# Patient Record
Sex: Male | Born: 1990 | Race: Black or African American | Hispanic: No | Marital: Single | Smoking: Former smoker
Health system: Southern US, Community
[De-identification: ages and names within clinical notes are randomized; demographics above are authoritative.]

## PROBLEM LIST (undated history)

## (undated) DIAGNOSIS — K219 Gastro-esophageal reflux disease without esophagitis: Secondary | ICD-10-CM

## (undated) HISTORY — PX: NO PAST SURGERIES: SHX2092

---

## 2014-01-25 ENCOUNTER — Emergency Department (HOSPITAL_COMMUNITY): Payer: Self-pay

## 2014-01-25 ENCOUNTER — Emergency Department (HOSPITAL_COMMUNITY): Payer: Self-pay | Admitting: Certified Registered"

## 2014-01-25 ENCOUNTER — Emergency Department (HOSPITAL_COMMUNITY): Payer: MEDICAID | Admitting: Certified Registered"

## 2014-01-25 ENCOUNTER — Encounter (HOSPITAL_COMMUNITY): Payer: Self-pay | Admitting: Emergency Medicine

## 2014-01-25 ENCOUNTER — Encounter (HOSPITAL_COMMUNITY): Admission: EM | Disposition: A | Discharge status: Home / Self Care | Payer: Self-pay | Source: Home / Self Care

## 2014-01-25 ENCOUNTER — Inpatient Hospital Stay (HOSPITAL_COMMUNITY)
Admission: EM | Admit: 2014-01-25 | Discharge: 2014-01-29 | DRG: 342 | Disposition: A | Discharge status: Home / Self Care | Payer: Self-pay | Attending: General Surgery | Admitting: General Surgery

## 2014-01-25 DIAGNOSIS — K920 Hematemesis: Secondary | ICD-10-CM | POA: Diagnosis present

## 2014-01-25 DIAGNOSIS — R160 Hepatomegaly, not elsewhere classified: Secondary | ICD-10-CM | POA: Diagnosis present

## 2014-01-25 DIAGNOSIS — R1031 Right lower quadrant pain: Secondary | ICD-10-CM

## 2014-01-25 DIAGNOSIS — R161 Splenomegaly, not elsewhere classified: Secondary | ICD-10-CM | POA: Diagnosis present

## 2014-01-25 DIAGNOSIS — Z87891 Personal history of nicotine dependence: Secondary | ICD-10-CM

## 2014-01-25 DIAGNOSIS — K3580 Unspecified acute appendicitis: Principal | ICD-10-CM | POA: Diagnosis present

## 2014-01-25 DIAGNOSIS — D72829 Elevated white blood cell count, unspecified: Secondary | ICD-10-CM

## 2014-01-25 DIAGNOSIS — R1013 Epigastric pain: Secondary | ICD-10-CM | POA: Insufficient documentation

## 2014-01-25 DIAGNOSIS — K353 Acute appendicitis with localized peritonitis, without perforation or gangrene: Secondary | ICD-10-CM

## 2014-01-25 DIAGNOSIS — K219 Gastro-esophageal reflux disease without esophagitis: Secondary | ICD-10-CM | POA: Diagnosis present

## 2014-01-25 HISTORY — DX: Gastro-esophageal reflux disease without esophagitis: K21.9

## 2014-01-25 HISTORY — PX: LAPAROSCOPIC APPENDECTOMY: SHX408

## 2014-01-25 LAB — COMPREHENSIVE METABOLIC PANEL
ALT: 30 U/L (ref 0–53)
AST: 16 U/L (ref 0–37)
Albumin: 4.9 g/dL (ref 3.5–5.2)
Alkaline Phosphatase: 110 U/L (ref 39–117)
Anion gap: 15 (ref 5–15)
BILIRUBIN TOTAL: 1.1 mg/dL (ref 0.3–1.2)
BUN: 14 mg/dL (ref 6–23)
CALCIUM: 10.7 mg/dL — AB (ref 8.4–10.5)
CHLORIDE: 93 meq/L — AB (ref 96–112)
CO2: 30 meq/L (ref 19–32)
Creatinine, Ser: 0.99 mg/dL (ref 0.50–1.35)
GFR calc Af Amer: >90 mL/min (ref >90)
GLUCOSE: 117 mg/dL — AB (ref 70–99)
Potassium: 4.7 mEq/L (ref 3.7–5.3)
SODIUM: 138 meq/L (ref 137–147)
Total Protein: 8.9 g/dL — ABNORMAL HIGH (ref 6.0–8.3)

## 2014-01-25 LAB — CBC WITH DIFFERENTIAL/PLATELET
Basophils Absolute: 0 10*3/uL (ref 0.0–0.1)
Basophils Relative: 0 % (ref 0–1)
Eosinophils Absolute: 0 10*3/uL (ref 0.0–0.7)
Eosinophils Relative: 0 % (ref 0–5)
HEMATOCRIT: 45.7 % (ref 39.0–52.0)
Hemoglobin: 16.7 g/dL (ref 13.0–17.0)
LYMPHS ABS: 1.2 10*3/uL (ref 0.7–4.0)
LYMPHS PCT: 7 % — AB (ref 12–46)
MCH: 32 pg (ref 26.0–34.0)
MCHC: 36.5 g/dL — ABNORMAL HIGH (ref 30.0–36.0)
MCV: 87.5 fL (ref 78.0–100.0)
MONO ABS: 1.3 10*3/uL — AB (ref 0.1–1.0)
Monocytes Relative: 7 % (ref 3–12)
Neutro Abs: 14.7 10*3/uL — ABNORMAL HIGH (ref 1.7–7.7)
Neutrophils Relative %: 86 % — ABNORMAL HIGH (ref 43–77)
PLATELETS: 278 10*3/uL (ref 150–400)
RBC: 5.22 MIL/uL (ref 4.22–5.81)
RDW: 12 % (ref 11.5–15.5)
WBC: 17.1 10*3/uL — AB (ref 4.0–10.5)

## 2014-01-25 LAB — URINALYSIS, ROUTINE W REFLEX MICROSCOPIC
GLUCOSE, UA: NEGATIVE mg/dL
Hgb urine dipstick: NEGATIVE
KETONES UR: 15 mg/dL — AB
Leukocytes, UA: NEGATIVE
Nitrite: NEGATIVE
PROTEIN: 30 mg/dL — AB
Specific Gravity, Urine: 1.025 (ref 1.005–1.030)
Urobilinogen, UA: 1 mg/dL (ref 0.0–1.0)
pH: 8 (ref 5.0–8.0)

## 2014-01-25 LAB — LIPASE, BLOOD: Lipase: 11 U/L (ref 11–59)

## 2014-01-25 LAB — URINE MICROSCOPIC-ADD ON: Urine-Other: NONE SEEN

## 2014-01-25 LAB — POC OCCULT BLOOD, ED: Fecal Occult Bld: POSITIVE — AB

## 2014-01-25 SURGERY — APPENDECTOMY, LAPAROSCOPIC
Anesthesia: General | Site: Abdomen

## 2014-01-25 MED ORDER — LACTATED RINGERS IR SOLN
Status: DC | PRN
  Administered 2014-01-25: 1

## 2014-01-25 MED ORDER — BUPIVACAINE-EPINEPHRINE 0.25% -1:200000 IJ SOLN
INTRAMUSCULAR | Status: DC | PRN
  Administered 2014-01-25: 13 mL

## 2014-01-25 MED ORDER — DEXAMETHASONE SODIUM PHOSPHATE 10 MG/ML IJ SOLN
INTRAMUSCULAR | Status: DC | PRN
  Administered 2014-01-25: 10 mg via INTRAVENOUS

## 2014-01-25 MED ORDER — LIDOCAINE HCL (CARDIAC) 20 MG/ML IV SOLN
INTRAVENOUS | Status: AC
  Filled 2014-01-25: qty 5

## 2014-01-25 MED ORDER — ROCURONIUM BROMIDE 100 MG/10ML IV SOLN
INTRAVENOUS | Status: DC | PRN
  Administered 2014-01-25: 30 mg via INTRAVENOUS
  Administered 2014-01-25: 10 mg via INTRAVENOUS

## 2014-01-25 MED ORDER — IOHEXOL 300 MG/ML  SOLN
100.0000 mL | Freq: Once | INTRAMUSCULAR | Status: AC | PRN
  Administered 2014-01-25: 100 mL via INTRAVENOUS

## 2014-01-25 MED ORDER — HYDROMORPHONE HCL 1 MG/ML IJ SOLN
1.0000 mg | Freq: Once | INTRAMUSCULAR | Status: AC
  Administered 2014-01-25: 1 mg via INTRAVENOUS
  Filled 2014-01-25: qty 1

## 2014-01-25 MED ORDER — PANTOPRAZOLE SODIUM 40 MG IV SOLR
40.0000 mg | Freq: Once | INTRAVENOUS | Status: AC
  Administered 2014-01-25: 40 mg via INTRAVENOUS
  Filled 2014-01-25: qty 40

## 2014-01-25 MED ORDER — 0.9 % SODIUM CHLORIDE (POUR BTL) OPTIME
TOPICAL | Status: DC | PRN
  Administered 2014-01-25: 1000 mL

## 2014-01-25 MED ORDER — ONDANSETRON HCL 4 MG/2ML IJ SOLN
INTRAMUSCULAR | Status: AC
  Filled 2014-01-25: qty 2

## 2014-01-25 MED ORDER — SUCCINYLCHOLINE CHLORIDE 20 MG/ML IJ SOLN
INTRAMUSCULAR | Status: DC | PRN
  Administered 2014-01-25: 100 mg via INTRAVENOUS

## 2014-01-25 MED ORDER — MIDAZOLAM HCL 5 MG/5ML IJ SOLN
INTRAMUSCULAR | Status: DC | PRN
  Administered 2014-01-25: 2 mg via INTRAVENOUS

## 2014-01-25 MED ORDER — HYDROMORPHONE HCL 1 MG/ML IJ SOLN
0.2500 mg | INTRAMUSCULAR | Status: DC | PRN
  Administered 2014-01-26 (×3): 0.5 mg via INTRAVENOUS
  Administered 2014-01-26: 0.25 mg via INTRAVENOUS

## 2014-01-25 MED ORDER — BUPIVACAINE-EPINEPHRINE (PF) 0.25% -1:200000 IJ SOLN
INTRAMUSCULAR | Status: AC
  Filled 2014-01-25: qty 30

## 2014-01-25 MED ORDER — SODIUM CHLORIDE 0.9 % IV SOLN
1.0000 g | INTRAVENOUS | Status: DC
  Administered 2014-01-25: 1 g via INTRAVENOUS
  Filled 2014-01-25: qty 1

## 2014-01-25 MED ORDER — FENTANYL CITRATE 0.05 MG/ML IJ SOLN
INTRAMUSCULAR | Status: AC
  Filled 2014-01-25: qty 5

## 2014-01-25 MED ORDER — FENTANYL CITRATE 0.05 MG/ML IJ SOLN
INTRAMUSCULAR | Status: DC | PRN
  Administered 2014-01-25 (×2): 50 ug via INTRAVENOUS
  Administered 2014-01-25: 100 ug via INTRAVENOUS
  Administered 2014-01-26: 50 ug via INTRAVENOUS

## 2014-01-25 MED ORDER — NEOSTIGMINE METHYLSULFATE 10 MG/10ML IV SOLN
INTRAVENOUS | Status: DC | PRN
  Administered 2014-01-25: 4 mg via INTRAVENOUS

## 2014-01-25 MED ORDER — SODIUM CHLORIDE 0.9 % IV BOLUS (SEPSIS)
1000.0000 mL | Freq: Once | INTRAVENOUS | Status: AC
  Administered 2014-01-25: 1000 mL via INTRAVENOUS

## 2014-01-25 MED ORDER — PROPOFOL 10 MG/ML IV BOLUS
INTRAVENOUS | Status: AC
  Filled 2014-01-25: qty 20

## 2014-01-25 MED ORDER — ONDANSETRON HCL 4 MG/2ML IJ SOLN
INTRAMUSCULAR | Status: DC | PRN
  Administered 2014-01-25: 4 mg via INTRAVENOUS

## 2014-01-25 MED ORDER — LACTATED RINGERS IV SOLN
INTRAVENOUS | Status: DC | PRN
  Administered 2014-01-25 (×2): via INTRAVENOUS

## 2014-01-25 MED ORDER — IOHEXOL 300 MG/ML  SOLN
25.0000 mL | Freq: Once | INTRAMUSCULAR | Status: AC | PRN
  Administered 2014-01-25: 25 mL via ORAL

## 2014-01-25 MED ORDER — LACTATED RINGERS IV SOLN: INTRAVENOUS | Status: DC

## 2014-01-25 MED ORDER — GLYCOPYRROLATE 0.2 MG/ML IJ SOLN
INTRAMUSCULAR | Status: DC | PRN
  Administered 2014-01-25: 0.6 mg via INTRAVENOUS

## 2014-01-25 MED ORDER — PROMETHAZINE HCL 25 MG/ML IJ SOLN: 6.2500 mg | INTRAMUSCULAR | Status: DC | PRN

## 2014-01-25 MED ORDER — ROCURONIUM BROMIDE 100 MG/10ML IV SOLN
INTRAVENOUS | Status: AC
  Filled 2014-01-25: qty 1

## 2014-01-25 MED ORDER — LIDOCAINE HCL (CARDIAC) 20 MG/ML IV SOLN
INTRAVENOUS | Status: DC | PRN
  Administered 2014-01-25: 50 mg via INTRAVENOUS

## 2014-01-25 MED ORDER — SODIUM CHLORIDE 0.9 % IV SOLN
INTRAVENOUS | Status: AC
  Filled 2014-01-25: qty 1

## 2014-01-25 MED ORDER — PROPOFOL 10 MG/ML IV BOLUS
INTRAVENOUS | Status: DC | PRN
  Administered 2014-01-25: 200 mg via INTRAVENOUS

## 2014-01-25 MED ORDER — DEXAMETHASONE SODIUM PHOSPHATE 10 MG/ML IJ SOLN
INTRAMUSCULAR | Status: AC
  Filled 2014-01-25: qty 1

## 2014-01-25 MED ORDER — MIDAZOLAM HCL 2 MG/2ML IJ SOLN
INTRAMUSCULAR | Status: AC
  Filled 2014-01-25: qty 2

## 2014-01-25 SURGICAL SUPPLY — 34 items
APPLIER CLIP ROT 10 11.4 M/L (STAPLE) ×3
CANISTER SUCTION 2500CC (MISCELLANEOUS) IMPLANT
CLIP APPLIE ROT 10 11.4 M/L (STAPLE) ×1 IMPLANT
CUTTER FLEX LINEAR 45M (STAPLE) ×3 IMPLANT
DECANTER SPIKE VIAL GLASS SM (MISCELLANEOUS) ×3 IMPLANT
DRAPE LAPAROSCOPIC ABDOMINAL (DRAPES) ×3 IMPLANT
DRAPE UTILITY XL STRL (DRAPES) ×3 IMPLANT
ELECT REM PT RETURN 9FT ADLT (ELECTROSURGICAL) ×3
ELECTRODE REM PT RTRN 9FT ADLT (ELECTROSURGICAL) ×1 IMPLANT
ENDOLOOP SUT PDS II  0 18 (SUTURE)
ENDOLOOP SUT PDS II 0 18 (SUTURE) IMPLANT
GLOVE BIO SURGEON STRL SZ7.5 (GLOVE) ×6 IMPLANT
GOWN STRL REUS W/ TWL XL LVL3 (GOWN DISPOSABLE) ×1 IMPLANT
GOWN STRL REUS W/TWL XL LVL3 (GOWN DISPOSABLE) ×8 IMPLANT
IV LACTATED RINGERS 1000ML (IV SOLUTION) ×3 IMPLANT
KIT BASIN OR (CUSTOM PROCEDURE TRAY) ×3 IMPLANT
LIQUID BAND (GAUZE/BANDAGES/DRESSINGS) ×3 IMPLANT
NS IRRIG 1000ML POUR BTL (IV SOLUTION) ×3 IMPLANT
PENCIL BUTTON HOLSTER BLD 10FT (ELECTRODE) IMPLANT
POUCH SPECIMEN RETRIEVAL 10MM (ENDOMECHANICALS) ×3 IMPLANT
RELOAD 45 THICK GREEN (ENDOMECHANICALS) ×18 IMPLANT
RELOAD 45 VASCULAR/THIN (ENDOMECHANICALS) IMPLANT
RELOAD STAPLE TA45 3.5 REG BLU (ENDOMECHANICALS) ×3 IMPLANT
SET IRRIG TUBING LAPAROSCOPIC (IRRIGATION / IRRIGATOR) IMPLANT
SHEARS HARMONIC ACE PLUS 36CM (ENDOMECHANICALS) IMPLANT
SOLUTION ANTI FOG 6CC (MISCELLANEOUS) ×3 IMPLANT
SUT MNCRL AB 4-0 PS2 18 (SUTURE) ×3 IMPLANT
TOWEL OR 17X26 10 PK STRL BLUE (TOWEL DISPOSABLE) ×3 IMPLANT
TRAY FOLEY CATH 14FRSI W/METER (CATHETERS) ×3 IMPLANT
TRAY LAPAROSCOPIC (CUSTOM PROCEDURE TRAY) ×3 IMPLANT
TROCAR BLADELESS OPT 5 75 (ENDOMECHANICALS) ×3 IMPLANT
TROCAR SLEEVE XCEL 5X75 (ENDOMECHANICALS) ×3 IMPLANT
TROCAR XCEL BLUNT TIP 100MML (ENDOMECHANICALS) ×3 IMPLANT
TUBING INSUFFLATION 10FT LAP (TUBING) ×3 IMPLANT

## 2014-01-25 NOTE — Anesthesia Preprocedure Evaluation (Signed)
Anesthesia Evaluation  Patient identified by MRN, date of birth, ID band Patient awake    Reviewed: Allergy & Precautions, H&P , NPO status , Patient's Chart, lab work & pertinent test results  Airway Mallampati: II  TM Distance: >3 FB Neck ROM: Full    Dental no notable dental hx.    Pulmonary former smoker,  breath sounds clear to auscultation  Pulmonary exam normal       Cardiovascular negative cardio ROS  Rhythm:Regular Rate:Normal     Neuro/Psych negative neurological ROS  negative psych ROS   GI/Hepatic Neg liver ROS, GERD-  ,  Endo/Other  negative endocrine ROS  Renal/GU negative Renal ROS  negative genitourinary   Musculoskeletal negative musculoskeletal ROS (+)   Abdominal   Peds negative pediatric ROS (+)  Hematology negative hematology ROS (+)   Anesthesia Other Findings   Reproductive/Obstetrics negative OB ROS                             Anesthesia Physical Anesthesia Plan  ASA: II and emergent  Anesthesia Plan: General   Post-op Pain Management:    Induction: Intravenous  Airway Management Planned: Oral ETT  Additional Equipment:   Intra-op Plan:   Post-operative Plan: Extubation in OR  Informed Consent: I have reviewed the patients History and Physical, chart, labs and discussed the procedure including the risks, benefits and alternatives for the proposed anesthesia with the patient or authorized representative who has indicated his/her understanding and acceptance.   Dental advisory given  Plan Discussed with: CRNA  Anesthesia Plan Comments:         Anesthesia Quick Evaluation

## 2014-01-25 NOTE — H&P (Signed)
Dustin Singh is an 23 y.o. male.   Chief Complaint: abdominal pain HPI: The pt is a 23yo wm who presents with abdominal pain for the last 3 days. It started in his epigastric area and today moved to the RLQ. He has had significant nausea and vomiting associated with it. He denies fever. CT shows appendicitis but no evidence of rupture  Past Medical History  Diagnosis Date  . GERD (gastroesophageal reflux disease)     Past Surgical History  Procedure Laterality Date  . No past surgeries      History reviewed. No pertinent family history. Social History:  reports that he quit smoking about 6 months ago. His smoking use included Cigarettes. He smoked 0.00 packs per day for 8 years. He has never used smokeless tobacco. He reports that he drinks alcohol. He reports that he does not use illicit drugs.  Allergies:  Allergies  Allergen Reactions  . Sulfa Antibiotics Other (See Comments)    Reaction occurred in childhood. Pt can't remember what happened     (Not in a hospital admission)  Results for orders placed or performed during the hospital encounter of 01/25/14 (from the past 48 hour(s))  CBC with Differential     Status: Abnormal   Collection Time: 01/25/14  4:25 PM  Result Value Ref Range   WBC 17.1 (H) 4.0 - 10.5 K/uL   RBC 5.22 4.22 - 5.81 MIL/uL   Hemoglobin 16.7 13.0 - 17.0 g/dL   HCT 87.4 89.7 - 35.7 %   MCV 87.5 78.0 - 100.0 fL   MCH 32.0 26.0 - 34.0 pg   MCHC 36.5 (H) 30.0 - 36.0 g/dL   RDW 24.2 42.4 - 44.5 %   Platelets 278 150 - 400 K/uL   Neutrophils Relative % 86 (H) 43 - 77 %   Neutro Abs 14.7 (H) 1.7 - 7.7 K/uL   Lymphocytes Relative 7 (L) 12 - 46 %   Lymphs Abs 1.2 0.7 - 4.0 K/uL   Monocytes Relative 7 3 - 12 %   Monocytes Absolute 1.3 (H) 0.1 - 1.0 K/uL   Eosinophils Relative 0 0 - 5 %   Eosinophils Absolute 0.0 0.0 - 0.7 K/uL   Basophils Relative 0 0 - 1 %   Basophils Absolute 0.0 0.0 - 0.1 K/uL  Comprehensive metabolic panel     Status: Abnormal   Collection Time: 01/25/14  4:25 PM  Result Value Ref Range   Sodium 138 137 - 147 mEq/L   Potassium 4.7 3.7 - 5.3 mEq/L   Chloride 93 (L) 96 - 112 mEq/L   CO2 30 19 - 32 mEq/L   Glucose, Bld 117 (H) 70 - 99 mg/dL   BUN 14 6 - 23 mg/dL   Creatinine, Ser 3.67 0.50 - 1.35 mg/dL   Calcium 61.1 (H) 8.4 - 10.5 mg/dL   Total Protein 8.9 (H) 6.0 - 8.3 g/dL   Albumin 4.9 3.5 - 5.2 g/dL   AST 16 0 - 37 U/L   ALT 30 0 - 53 U/L   Alkaline Phosphatase 110 39 - 117 U/L   Total Bilirubin 1.1 0.3 - 1.2 mg/dL   GFR calc non Af Amer >90 >90 mL/min   GFR calc Af Amer >90 >90 mL/min    Comment: (NOTE) The eGFR has been calculated using the CKD EPI equation. This calculation has not been validated in all clinical situations. eGFR's persistently <90 mL/min signify possible Chronic Kidney Disease.    Anion gap 15 5 -  15  Lipase, blood     Status: None   Collection Time: 01/25/14  4:25 PM  Result Value Ref Range   Lipase 11 11 - 59 U/L  Urinalysis, Routine w reflex microscopic     Status: Abnormal   Collection Time: 01/25/14  6:09 PM  Result Value Ref Range   Color, Urine AMBER (A) YELLOW    Comment: BIOCHEMICALS MAY BE AFFECTED BY COLOR   APPearance CLEAR CLEAR   Specific Gravity, Urine 1.025 1.005 - 1.030   pH 8.0 5.0 - 8.0   Glucose, UA NEGATIVE NEGATIVE mg/dL   Hgb urine dipstick NEGATIVE NEGATIVE   Bilirubin Urine SMALL (A) NEGATIVE   Ketones, ur 15 (A) NEGATIVE mg/dL   Protein, ur 30 (A) NEGATIVE mg/dL   Urobilinogen, UA 1.0 0.0 - 1.0 mg/dL   Nitrite NEGATIVE NEGATIVE   Leukocytes, UA NEGATIVE NEGATIVE  Urine microscopic-add on     Status: None   Collection Time: 01/25/14  6:09 PM  Result Value Ref Range   Urine-Other      NO FORMED ELEMENTS SEEN ON URINE MICROSCOPIC EXAMINATION  POC occult blood, ED     Status: Abnormal   Collection Time: 01/25/14  6:29 PM  Result Value Ref Range   Fecal Occult Bld POSITIVE (A) NEGATIVE   Ct Abdomen Pelvis W Contrast  01/25/2014   CLINICAL  DATA:  Three-day history of abdominal pain with tenderness right lower quadrant  EXAM: CT ABDOMEN AND PELVIS WITH CONTRAST  TECHNIQUE: Multidetector CT imaging of the abdomen and pelvis was performed using the standard protocol following bolus administration of intravenous contrast. Oral contrast was also administered.  CONTRAST:  59mL OMNIPAQUE IOHEXOL 300 MG/ML SOLN, 179mL OMNIPAQUE IOHEXOL 300 MG/ML SOLN  COMPARISON:  None.  FINDINGS: Lung bases are clear.  Liver is enlarged, measuring 19.3 cm in length. There is hepatic steatosis. No focal liver lesions are identified. Gallbladder wall is not thickened. There is no biliary duct dilatation.  Spleen is prominent measuring 12.5 x 13.7 x 9.1 cm. No focal splenic lesions are identified.  Pancreas and adrenals appear normal. Kidneys bilaterally show no mass or hydronephrosis on either side. There is no renal or ureteral calculus on either side.  In the pelvis, urinary bladder is midline with normal wall thickness. There is no pelvic mass or fluid collection.  The appendix is dilated with surrounding inflammation. There are two appendicoliths within the appendix. This appearance is consistent with acute appendicitis. There is no frank abscess. There is edema at the base of the appendix impressing upon the medial cecum.  There is no bowel obstruction.  No free air or portal venous air.  There is no ascites, adenopathy, or abscess in the abdomen or pelvis. There is no demonstrable abdominal aortic aneurysm. There are no blastic or lytic bone lesions.  IMPRESSION: Evidence of acute appendicitis without frank abscess.  Enlarged liver and spleen.  Hepatic steatosis is present.  No adenopathy. No abscess. No bowel obstruction. No renal or ureteral calculus. No hydronephrosis.  Critical Value/emergent results were called by telephone at the time of interpretation on 01/25/2014 at 8:25 pm to [facility], [place] , who verbally acknowledged these results.   Electronically Signed    By: Lowella Grip M.D.   On: 01/25/2014 20:25    Review of Systems  Constitutional: Negative.   HENT: Negative.   Eyes: Negative.   Respiratory: Negative.   Cardiovascular: Negative.   Gastrointestinal: Positive for nausea, vomiting and abdominal pain.  Genitourinary: Negative.  Musculoskeletal: Positive for back pain.  Skin: Negative.   Neurological: Negative.   Endo/Heme/Allergies: Negative.   Psychiatric/Behavioral: Negative.     Blood pressure 127/69, pulse 70, temperature 98.5 F (36.9 C), temperature source Oral, resp. rate 20, SpO2 97 %. Physical Exam  Constitutional: He is oriented to person, place, and time. He appears well-developed and well-nourished.  HENT:  Head: Normocephalic and atraumatic.  Eyes: Conjunctivae and EOM are normal. Pupils are equal, round, and reactive to light.  Neck: Normal range of motion. Neck supple.  Cardiovascular: Normal rate, regular rhythm and normal heart sounds.   Respiratory: Effort normal and breath sounds normal.  GI: Soft. Bowel sounds are normal.  Tender in RLQ. No peritonitis  Musculoskeletal: Normal range of motion.  Neurological: He is alert and oriented to person, place, and time.  Skin: Skin is warm and dry.  Psychiatric: He has a normal mood and affect. His behavior is normal.     Assessment/Plan The pt appears to have acute appendicitis. Because of the risk of perforation and sepsis I think he would benefit from having his appendix removed. I have discussed with him the risks and benefits of the surgery as well as some of the technical aspects and he understands and wishes to proceed.  TOTH III,Makynlee Kressin S 01/25/2014, 9:46 PM

## 2014-01-25 NOTE — ED Provider Notes (Signed)
CSN: [id number]     Arrival date & time 01/25/14  1539 History   First MD Initiated Contact with Patient 01/25/14 1617     Chief Complaint  Patient presents with  . Hematemesis  . Abdominal Pain     (Consider location/radiation/quality/duration/timing/severity/associated sxs/prior Treatment) HPI Mr. Weightman is a 23 year old male with no past medical history who presents the ER tonight for hematemesis.patient states his hematemesis began 3 days ago, and has a dark red/coffee ground appearance. Patient denies nausea, and states that hi hematemesis begins whenever he feels pain in his abdomen. Patient  Reports an intermittent epigastric pain which is sharp and radiates to his back.patient states for the past several weeks he has noticed some "heartburn" and has been taking over-the-counter for relief. Patient states the pain causes him to vomit.patient denies dizziness, weakness, chest pain, shortness of breath, diarrhea, dysuria, hematochezia, melena.patient reports he was seen at an urgent care earlier today who recommended he be seen in the emergency room. He had a workup with blood work, chest x-rays there. I am unable to obtain results from his visit earlier today.  Past Medical History  Diagnosis Date  . GERD (gastroesophageal reflux disease)    Past Surgical History  Procedure Laterality Date  . No past surgeries     History reviewed. No pertinent family history. History  Substance Use Topics  . Smoking status: Former Smoker -- 8 years    Types: Cigarettes    Quit date: 07/25/2013  . Smokeless tobacco: Never Used  . Alcohol Use: Yes     Comment: 4 beers every other day    Review of Systems  Constitutional: Negative for fever.  HENT: Negative for trouble swallowing.   Eyes: Negative for visual disturbance.  Respiratory: Negative for shortness of breath.   Cardiovascular: Negative for chest pain.  Gastrointestinal: Positive for abdominal pain. Negative for nausea, vomiting and  blood in stool.  Genitourinary: Negative for dysuria.  Musculoskeletal: Negative for neck pain.  Skin: Negative for rash.  Neurological: Negative for dizziness, weakness and numbness.  Psychiatric/Behavioral: Negative.       Allergies  Sulfa antibiotics  Home Medications   Prior to Admission medications   Medication Sig Start Date End Date Taking? Authorizing Provider  aspirin 325 MG tablet Take 650 mg by mouth daily as needed for moderate pain (pain).   Yes Historical Provider, MD  ibuprofen (ADVIL,MOTRIN) 200 MG tablet Take 800 mg by mouth every 6 (six) hours as needed for moderate pain (pain).   Yes Historical Provider, MD   BP 136/75 mmHg  Pulse 75  Temp(Src) 98.8 F (37.1 C) (Oral)  Resp 12  SpO2 97% Physical Exam  Constitutional: He is oriented to person, place, and time. He appears well-developed and well-nourished. No distress.  HENT:  Head: Normocephalic and atraumatic.  Mouth/Throat: Oropharynx is clear and moist. No oropharyngeal exudate.  Eyes: Right eye exhibits no discharge. Left eye exhibits no discharge. No scleral icterus.  Neck: Normal range of motion.  Cardiovascular: Normal rate, regular rhythm, S1 normal, S2 normal and normal heart sounds.   No murmur heard. Pulses:      Radial pulses are 2+ on the right side, and 2+ on the left side.       Dorsalis pedis pulses are 2+ on the right side, and 2+ on the left side.  Pulmonary/Chest: Effort normal and breath sounds normal. No accessory muscle usage. No tachypnea. No respiratory distress.  Abdominal: Soft. Normal appearance and bowel sounds are normal.  There is tenderness in the right lower quadrant. There is tenderness at McBurney's point. There is no rigidity, no rebound, no guarding and negative Murphy's sign.  Musculoskeletal: Normal range of motion. He exhibits no edema or tenderness.  Neurological: He is alert and oriented to person, place, and time. He has normal strength. No cranial nerve deficit or  sensory deficit. Coordination normal. GCS eye subscore is 4. GCS verbal subscore is 5. GCS motor subscore is 6.  Skin: Skin is warm and dry. No rash noted. He is not diaphoretic.  Psychiatric: He has a normal mood and affect.  Nursing note and vitals reviewed.   ED Course  Procedures (including critical care time) Labs Review Labs Reviewed  CBC WITH DIFFERENTIAL - Abnormal; Notable for the following:    WBC 17.1 (*)    MCHC 36.5 (*)    Neutrophils Relative % 86 (*)    Neutro Abs 14.7 (*)    Lymphocytes Relative 7 (*)    Monocytes Absolute 1.3 (*)    All other components within normal limits  COMPREHENSIVE METABOLIC PANEL - Abnormal; Notable for the following:    Chloride 93 (*)    Glucose, Bld 117 (*)    Calcium 10.7 (*)    Total Protein 8.9 (*)    All other components within normal limits  URINALYSIS, ROUTINE W REFLEX MICROSCOPIC - Abnormal; Notable for the following:    Color, Urine AMBER (*)    Bilirubin Urine SMALL (*)    Ketones, ur 15 (*)    Protein, ur 30 (*)    All other components within normal limits  POC OCCULT BLOOD, ED - Abnormal; Notable for the following:    Fecal Occult Bld POSITIVE (*)    All other components within normal limits  LIPASE, BLOOD  URINE MICROSCOPIC-ADD ON    Imaging Review Ct Abdomen Pelvis W Contrast  01/25/2014   CLINICAL DATA:  Three-day history of abdominal pain with tenderness right lower quadrant  EXAM: CT ABDOMEN AND PELVIS WITH CONTRAST  TECHNIQUE: Multidetector CT imaging of the abdomen and pelvis was performed using the standard protocol following bolus administration of intravenous contrast. Oral contrast was also administered.  CONTRAST:  25mL OMNIPAQUE IOHEXOL 300 MG/ML SOLN, 100mL OMNIPAQUE IOHEXOL 300 MG/ML SOLN  COMPARISON:  None.  FINDINGS: Lung bases are clear.  Liver is enlarged, measuring 19.3 cm in length. There is hepatic steatosis. No focal liver lesions are identified. Gallbladder wall is not thickened. There is no biliary  duct dilatation.  Spleen is prominent measuring 12.5 x 13.7 x 9.1 cm. No focal splenic lesions are identified.  Pancreas and adrenals appear normal. Kidneys bilaterally show no mass or hydronephrosis on either side. There is no renal or ureteral calculus on either side.  In the pelvis, urinary bladder is midline with normal wall thickness. There is no pelvic mass or fluid collection.  The appendix is dilated with surrounding inflammation. There are two appendicoliths within the appendix. This appearance is consistent with acute appendicitis. There is no frank abscess. There is edema at the base of the appendix impressing upon the medial cecum.  There is no bowel obstruction.  No free air or portal venous air.  There is no ascites, adenopathy, or abscess in the abdomen or pelvis. There is no demonstrable abdominal aortic aneurysm. There are no blastic or lytic bone lesions.  IMPRESSION: Evidence of acute appendicitis without frank abscess.  Enlarged liver and spleen.  Hepatic steatosis is present.  No adenopathy. No abscess. No  bowel obstruction. No renal or ureteral calculus. No hydronephrosis.  Critical Value/emergent results were called by telephone at the time of interpretation on 01/25/2014 at 8:25 pm to [facility] Gosnell[facility] , who verbally acknowledged these results.   Electronically Signed   By: Bretta BangWilliam  Woodruff M.D.   On: 01/25/2014 20:25     EKG Interpretation None      MDM   Final diagnoses:  Right lower quadrant abdominal pain    Patient here complaining of intermittent abdominal pain, hematemesis. Patient's pain reproducible in right lower quadrant, lab work shows leukocytosis of 17. We will follow-up with CT abdomen pelvis for rule out of appendicitis. Fecal occult blood positive.Symptomatic therapy.  8:25 PM: Notified by Dr. Margarita GrizzleWoodruff with radiology that patient has an acute appendicitis on CT. Consult placed to general surgery.  Spoke with scrub nurse in the room with Dr. Carolynne Edouardoth in the OR  who was made aware of patient's case. Dr. Carolynne Edouardoth agreed to evaluate patient to accept for surgery.The patient appears reasonably stabilized for admission considering the current resources, flow, and capabilities available in the ED at this time, and I doubt any other [facility] further screening and/or treatment in the ED prior to admission.  BP 136/75 mmHg  Pulse 75  Temp(Src) 98.8 F (37.1 C) (Oral)  Resp 12  SpO2 97%  Signed,  Ladona MowJoe Saahas Hidrogo, PA-C 2:03 AM Patient seen and discussed with Dr. Jerelyn ScottMartha Linker, M.D.   Monte FantasiaJoseph W Maven Varelas, PA-C 01/26/14 [id number]  Ethelda ChickMartha K Linker, MD 01/26/14 [phone]

## 2014-01-25 NOTE — ED Notes (Signed)
Pt states he went to urgent care and had blood taken and xrays done. States he has had abdominal pain and has been throwing up blood. States his pain is mostly midabdomen but he had pain upon palpation in the lower R quadrant. Alert and oriented.

## 2014-01-26 ENCOUNTER — Encounter (HOSPITAL_COMMUNITY): Payer: Self-pay | Admitting: General Surgery

## 2014-01-26 DIAGNOSIS — K3580 Unspecified acute appendicitis: Secondary | ICD-10-CM | POA: Diagnosis present

## 2014-01-26 MED ORDER — HYDROMORPHONE HCL 1 MG/ML IJ SOLN
INTRAMUSCULAR | Status: AC
  Filled 2014-01-26: qty 1

## 2014-01-26 MED ORDER — OXYCODONE-ACETAMINOPHEN 5-325 MG PO TABS
1.0000 | ORAL_TABLET | ORAL | Status: DC | PRN
  Administered 2014-01-26 – 2014-01-29 (×10): 2 via ORAL
  Filled 2014-01-26 (×10): qty 2

## 2014-01-26 MED ORDER — HYDROMORPHONE HCL 1 MG/ML IJ SOLN
INTRAMUSCULAR | Status: AC
  Administered 2014-01-26: 0.5 mg via INTRAVENOUS
  Filled 2014-01-26: qty 1

## 2014-01-26 MED ORDER — KCL IN DEXTROSE-NACL 20-5-0.9 MEQ/L-%-% IV SOLN
INTRAVENOUS | Status: DC
  Administered 2014-01-26: 15:00:00 via INTRAVENOUS
  Administered 2014-01-26: 100 mL via INTRAVENOUS
  Administered 2014-01-27 – 2014-01-28 (×2): via INTRAVENOUS
  Filled 2014-01-26 (×6): qty 1000

## 2014-01-26 MED ORDER — MORPHINE SULFATE 4 MG/ML IJ SOLN
INTRAMUSCULAR | Status: AC
  Filled 2014-01-26: qty 1

## 2014-01-26 MED ORDER — HYDROMORPHONE HCL 1 MG/ML IJ SOLN
1.0000 mg | INTRAMUSCULAR | Status: DC | PRN
  Administered 2014-01-26 – 2014-01-28 (×13): 1 mg via INTRAVENOUS
  Filled 2014-01-26 (×13): qty 1

## 2014-01-26 MED ORDER — SODIUM CHLORIDE 0.9 % IV SOLN
1.0000 g | INTRAVENOUS | Status: DC
  Administered 2014-01-26 – 2014-01-28 (×3): 1 g via INTRAVENOUS
  Filled 2014-01-26 (×3): qty 1

## 2014-01-26 MED ORDER — MORPHINE SULFATE 4 MG/ML IJ SOLN
4.0000 mg | INTRAMUSCULAR | Status: DC | PRN
  Administered 2014-01-26: 4 mg via INTRAVENOUS

## 2014-01-26 MED ORDER — ONDANSETRON HCL 4 MG PO TABS: 4.0000 mg | ORAL_TABLET | Freq: Four times a day (QID) | ORAL | Status: DC | PRN

## 2014-01-26 MED ORDER — ONDANSETRON HCL 4 MG/2ML IJ SOLN: 4.0000 mg | Freq: Four times a day (QID) | INTRAMUSCULAR | Status: DC | PRN

## 2014-01-26 MED ORDER — HEPARIN SODIUM (PORCINE) 5000 UNIT/ML IJ SOLN
5000.0000 [IU] | Freq: Three times a day (TID) | INTRAMUSCULAR | Status: DC
  Administered 2014-01-27 – 2014-01-29 (×7): 5000 [IU] via SUBCUTANEOUS
  Filled 2014-01-26 (×10): qty 1

## 2014-01-26 NOTE — Op Note (Signed)
01/25/2014  12:01 AM  PATIENT:  Dustin Singh  23 y.o. male  PRE-OPERATIVE DIAGNOSIS:  Appendicitis  POST-OPERATIVE DIAGNOSIS:  Appendicitis  PROCEDURE:  Procedure(s): APPENDECTOMY LAPAROSCOPIC (N/A)  SURGEON:  Surgeon(s) and Role:    * Griselda MinerPaul Toth III, MD - Primary  PHYSICIAN ASSISTANT:   ASSISTANTS: none   ANESTHESIA:   general  EBL:  Total I/O In: 2000 [I.V.:2000] Out: 350 [Urine:300; Blood:50]  BLOOD ADMINISTERED:none  DRAINS: none   LOCAL MEDICATIONS USED:  MARCAINE     SPECIMEN:  Source of Specimen:  appendix  DISPOSITION OF SPECIMEN:  PATHOLOGY  COUNTS:  YES  TOURNIQUET:  * No tourniquets in log *  DICTATION: .Dragon Dictation  After informed consent was obtained patient was brought to the operating room placed in the supine position on the operating room table. After adequate induction of general anesthesia the patient's abdomen was prepped with ChloraPrep, allowed to dry, and draped in usual sterile manner. The area below the umbilicus was infiltrated with quarter percent Marcaine. A small incision was made with a 15 blade knife. This incision was carried down through the subcutaneous tissue bluntly with a hemostat and [employer] retractors until the linea alba was identified. The linea alba was incised with a 15 blade knife. Each side was grasped Coker clamps and elevated anteriorly. The preperitoneal space was probed bluntly with a hemostat until the peritoneum was opened and access was gained to the abdominal cavity. A 0 Vicryl purse string stitch was placed in the fascia surrounding the opening. A Hassan cannula was placed through the opening and anchored in place with the previously placed Vicryl purse string stitch. The laparoscope was placed through the [facility] cannula. The abdomen was insufflated with carbon dioxide without difficulty. Next the suprapubic area was infiltrated with quarter percent Marcaine. A small incision was made with a 15 blade knife. A 5 mm  port was placed bluntly through this incision into the abdominal cavity. A site was then chosen between the 2 port for placement of a 5 mm port. The area was infiltrated with quarter percent Marcaine. A small stab incision was made with a 15 blade knife. A 5 mm port was placed bluntly through this incision and the abdominal cavity under direct vision. The laparoscope was then moved to the suprapubic port. Using a Glassman grasper and harmonic scalpel the right lower quadrant was inspected. The appendix was readily identified. The appendix was elevated anteriorly and the mesoappendix was taken down sharply with the harmonic scalpel. Once the base of the appendix where it joined the cecum was identified. The tissue at this point was very thick and inflamed. a laparoscopic GIA green load 6 row stapler was placed through the [facility] cannula. After the first firing of the stapler it appeared as though the tissue at the staple line was necrotic. More of the cecum was mobilized bluntly until we were able to get a stapler across the lateral wall of the cecum where the tissue appeared more healthy. The previous staple line was then removed with the rest of the appendix. A laparoscopic bag was then inserted through the [facility] cannula. The appendix was placed within the bag and the bag was sealed. The abdomen was then irrigated with copious amounts of saline until the effluent was clear. No other abnormalities were noted. The appendix and bag were removed with the [facility] cannula through the infraumbilical port without difficulty. The fascial defect was closed with the previously placed Vicryl pursestring stitch as well as with another  interrupted 0 Vicryl figure-of-eight stitch. The rest of the ports were removed under direct vision and were found to be hemostatic. The gas was allowed to escape. The skin incisions were closed with interrupted 4-0 Monocryl subcuticular stitches. Dermabond dressings were applied. The patient  tolerated the procedure well. At the end of the case all needle sponge and instrument counts were correct. The patient was then awakened and taken to recovery in stable condition.  PLAN OF CARE: Admit to inpatient   PATIENT DISPOSITION:  PACU - hemodynamically stable.   Delay start of Pharmacological VTE agent (>24hrs) due to surgical blood loss or risk of bleeding: no

## 2014-01-26 NOTE — Plan of Care (Signed)
Problem: Phase I Progression Outcomes Goal: OOB as tolerated unless otherwise ordered Outcome: Progressing Goal: Voiding-avoid urinary catheter unless indicated Outcome: Completed/Met Date Met:  01/26/14

## 2014-01-26 NOTE — Transfer of Care (Signed)
Immediate Anesthesia Transfer of Care Note  Patient: Dustin Singh  Procedure(s) Performed: Procedure(s): APPENDECTOMY LAPAROSCOPIC (N/A)  Patient Location: PACU  Anesthesia Type:General  Level of Consciousness: awake, alert  and oriented  Airway & Oxygen Therapy: Patient Spontanous Breathing and Patient connected to face mask oxygen  Post-op Assessment: Report given to PACU RN and Post -op Vital signs reviewed and stable  Post vital signs: Reviewed and stable  Complications: No apparent anesthesia complications

## 2014-01-26 NOTE — Discharge Instructions (Signed)
[facility] ______[facility] °LAPAROSCOPIC SURGERY: POST OP INSTRUCTIONS °Always review your discharge instruction sheet given to you by the facility where your surgery was performed. °IF YOU HAVE DISABILITY OR FAMILY LEAVE FORMS, YOU MUST BRING THEM TO THE OFFICE FOR PROCESSING.   °DO NOT GIVE THEM TO YOUR DOCTOR. ° °1. A prescription for pain medication may be given to you upon discharge.  Take your pain medication as prescribed, if needed.  If narcotic pain medicine is not needed, then you may take acetaminophen (Tylenol) or ibuprofen (Advil) as needed. °2. Take your usually prescribed medications unless otherwise directed. °3. If you need a refill on your pain medication, please contact your pharmacy.  They will contact our office to request authorization. Prescriptions will not be filled after 5pm or on week-ends. °4. You should follow a light diet the first few days after arrival home, such as soup and crackers, etc.  Be sure to include lots of fluids daily. °5. Most patients will experience some swelling and bruising in the area of the incisions.  Ice packs will help.  Swelling and bruising can take several days to resolve.  °6. It is common to experience some constipation if taking pain medication after surgery.  Increasing fluid intake and taking a stool softener (such as Colace) will usually help or prevent this problem from occurring.  A mild laxative (Milk of Magnesia or Miralax) should be taken according to package instructions if there are no bowel movements after 48 hours. °7. Unless discharge instructions indicate otherwise, you may remove your bandages 24-48 hours after surgery, and you may shower at that time.  You may have steri-strips (small skin tapes) in place directly over the incision.  These strips should be left on the skin for 7-10 days.  If your surgeon used skin glue on the incision, you may shower in 24 hours.  The glue will flake off over the next 2-3 weeks.  Any sutures or  staples will be removed at the office during your follow-up visit. °8. ACTIVITIES:  You may resume regular (light) daily activities beginning the next day--such as daily self-care, walking, climbing stairs--gradually increasing activities as tolerated.  You may have sexual intercourse when it is comfortable.  Refrain from any heavy lifting or straining until approved by your doctor. °a. You may drive when you are no longer taking prescription pain medication, you can comfortably wear a seatbelt, and you can safely maneuver your car and apply brakes. °b. RETURN TO WORK:  __________________________________________________________ °9. You should see your doctor in the office for a follow-up appointment approximately 2-3 weeks after your surgery.  Make sure that you call for this appointment within a day or two after you arrive home to insure a convenient appointment time. °10. OTHER INSTRUCTIONS: __________________________________________________________________________________________________________________________ __________________________________________________________________________________________________________________________ °WHEN TO CALL YOUR DOCTOR: °1. Fever over 101.0 °2. Inability to urinate °3. Continued bleeding from incision. °4. Increased pain, redness, or drainage from the incision. °5. Increasing abdominal pain ° °The clinic staff is available to answer your questions during regular business hours.  Please don’t hesitate to call and ask to speak to one of the nurses for clinical concerns.  If you have a medical emergency, go to the nearest emergency room or call 911.  A surgeon from [facility] is always on call at the hospital. °[address], [address], [place], [place]  [address] ? [address] [address], ,    [address] °[phone] ? [phone] ? FAX [phone] °Web site:   [email or web address] °

## 2014-01-26 NOTE — Progress Notes (Signed)
1 Day Post-Op  Subjective: Complains of soreness, tol po, ambulating  Objective: Vital signs in last 24 hours: Temp:  [98 F (36.7 C)-99.3 F (37.4 C)] 99 F (37.2 C) (11/05 0612) Pulse Rate:  [50-91] 91 (11/05 0612) Resp:  [12-20] 16 (11/05 0612) BP: (106-152)/(50-78) 106/50 mmHg (11/05 0612) SpO2:  [96 %-100 %] 98 % (11/05 0612) Weight:  [185 lb (83.915 kg)] 185 lb (83.915 kg) (11/05 0635)    Intake/Output from previous day: 11/04 0701 - 11/05 0700 In: 3908.3 [P.O.:960; I.V.:2948.3] Out: 1450 [Urine:1400; Blood:50] Intake/Output this shift:    General appearance: no distress Resp: clear to auscultation bilaterally Cardio: regular rate and rhythm GI: soft approp tender incisions clean  Lab Results:   Recent Labs  01/25/14 1625  WBC 17.1*  HGB 16.7  HCT 45.7  PLT 278   BMET  Recent Labs  01/25/14 1625  NA 138  K 4.7  CL 93*  CO2 30  GLUCOSE 117*  BUN 14  CREATININE 0.99  CALCIUM 10.7*   PT/INR No results for input(s): LABPROT, INR in the last 72 hours. ABG No results for input(s): PHART, HCO3 in the last 72 hours.  Invalid input(s): PCO2, PO2  Studies/Results: Ct Abdomen Pelvis W Contrast  01/25/2014   CLINICAL DATA:  Three-day history of abdominal pain with tenderness right lower quadrant  EXAM: CT ABDOMEN AND PELVIS WITH CONTRAST  TECHNIQUE: Multidetector CT imaging of the abdomen and pelvis was performed using the standard protocol following bolus administration of intravenous contrast. Oral contrast was also administered.  CONTRAST:  25mL OMNIPAQUE IOHEXOL 300 MG/ML SOLN, 100mL OMNIPAQUE IOHEXOL 300 MG/ML SOLN  COMPARISON:  None.  FINDINGS: Lung bases are clear.  Liver is enlarged, measuring 19.3 cm in length. There is hepatic steatosis. No focal liver lesions are identified. Gallbladder wall is not thickened. There is no biliary duct dilatation.  Spleen is prominent measuring 12.5 x 13.7 x 9.1 cm. No focal splenic lesions are identified.  Pancreas  and adrenals appear normal. Kidneys bilaterally show no mass or hydronephrosis on either side. There is no renal or ureteral calculus on either side.  In the pelvis, urinary bladder is midline with normal wall thickness. There is no pelvic mass or fluid collection.  The appendix is dilated with surrounding inflammation. There are two appendicoliths within the appendix. This appearance is consistent with acute appendicitis. There is no frank abscess. There is edema at the base of the appendix impressing upon the medial cecum.  There is no bowel obstruction.  No free air or portal venous air.  There is no ascites, adenopathy, or abscess in the abdomen or pelvis. There is no demonstrable abdominal aortic aneurysm. There are no blastic or lytic bone lesions.  IMPRESSION: Evidence of acute appendicitis without frank abscess.  Enlarged liver and spleen.  Hepatic steatosis is present.  No adenopathy. No abscess. No bowel obstruction. No renal or ureteral calculus. No hydronephrosis.  Critical Value/emergent results were called by telephone at the time of interpretation on 01/25/2014 at 8:25 pm to [facility] , who verbally acknowledged these results.   Electronically Signed   By: Bretta BangWilliam  Woodruff M.D.   On: 01/25/2014 20:25    Anti-infectives: Anti-infectives    Start     Dose/Rate Route Frequency Ordered Stop   01/26/14 2200  ertapenem (INVANZ) 1 g in sodium chloride 0.9 % 50 mL IVPB     1 g100 mL/hr over 30 Minutes Intravenous Every 24 hours 01/26/14 0109  01/25/14 2200  ertapenem (INVANZ) 1 g in sodium chloride 0.9 % 50 mL IVPB  Status:  Discontinued     1 g100 mL/hr over 30 Minutes Intravenous Every 24 hours 01/25/14 2145 01/26/14 0139      Assessment/Plan: POD 1 lap appy  Po pain meds with iv backup Will advance diet Another 24 hours iv abx per Dr Carolynne Edouardoth and then home on 5 day course if doing well in am   [facility] CenterWAKEFIELD,Dustin Bankson 01/26/2014

## 2014-01-26 NOTE — Progress Notes (Signed)
Patient states morphine not releiving his pain,but dilaudid releives his pain.DR TOTH notified  And order given for dilaudid.

## 2014-01-26 NOTE — Anesthesia Postprocedure Evaluation (Signed)
  Anesthesia Post-op Note  Patient: Dustin Singh  Procedure(s) Performed: Procedure(s) (LRB): APPENDECTOMY LAPAROSCOPIC (N/A)  Patient Location: PACU  Anesthesia Type: General  Level of Consciousness: awake and alert   Airway and Oxygen Therapy: Patient Spontanous Breathing  Post-op Pain: mild  Post-op Assessment: Post-op Vital signs reviewed, Patient's Cardiovascular Status Stable, Respiratory Function Stable, Patent Airway and No signs of Nausea or vomiting  Last Vitals:  Filed Vitals:   01/26/14 0612  BP: 106/50  Pulse: 91  Temp: 37.2 C  Resp: 16    Post-op Vital Signs: stable   Complications: No apparent anesthesia complications

## 2014-01-27 LAB — CBC
HCT: 33.5 % — ABNORMAL LOW (ref 39.0–52.0)
HEMOGLOBIN: 11.5 g/dL — AB (ref 13.0–17.0)
MCH: 30.9 pg (ref 26.0–34.0)
MCHC: 34.3 g/dL (ref 30.0–36.0)
MCV: 90.1 fL (ref 78.0–100.0)
PLATELETS: 177 10*3/uL (ref 150–400)
RBC: 3.72 MIL/uL — AB (ref 4.22–5.81)
RDW: 11.8 % (ref 11.5–15.5)
WBC: 7.9 10*3/uL (ref 4.0–10.5)

## 2014-01-27 LAB — COMPREHENSIVE METABOLIC PANEL
ALBUMIN: 3.3 g/dL — AB (ref 3.5–5.2)
ALT: 16 U/L (ref 0–53)
AST: 13 U/L (ref 0–37)
Alkaline Phosphatase: 73 U/L (ref 39–117)
Anion gap: 11 (ref 5–15)
BUN: 12 mg/dL (ref 6–23)
CO2: 30 mEq/L (ref 19–32)
CREATININE: 0.79 mg/dL (ref 0.50–1.35)
Calcium: 8.9 mg/dL (ref 8.4–10.5)
Chloride: 97 mEq/L (ref 96–112)
GFR calc non Af Amer: >90 mL/min (ref >90)
GLUCOSE: 99 mg/dL (ref 70–99)
Potassium: 4 mEq/L (ref 3.7–5.3)
Sodium: 138 mEq/L (ref 137–147)
Total Bilirubin: 1.1 mg/dL (ref 0.3–1.2)
Total Protein: 6.9 g/dL (ref 6.0–8.3)

## 2014-01-27 LAB — MONONUCLEOSIS SCREEN: Mono Screen: NEGATIVE

## 2014-01-27 LAB — LIPASE, BLOOD: LIPASE: 12 U/L (ref 11–59)

## 2014-01-27 MED ORDER — PANTOPRAZOLE SODIUM 40 MG PO TBEC
40.0000 mg | DELAYED_RELEASE_TABLET | Freq: Every day | ORAL | Status: DC
  Administered 2014-01-27 – 2014-01-28 (×2): 40 mg via ORAL
  Filled 2014-01-27 (×2): qty 1

## 2014-01-27 MED ORDER — SUCRALFATE 1 GM/10ML PO SUSP
1.0000 g | Freq: Two times a day (BID) | ORAL | Status: DC
  Administered 2014-01-27 – 2014-01-28 (×3): 1 g via ORAL
  Filled 2014-01-27 (×4): qty 10

## 2014-01-27 NOTE — Consult Note (Signed)
Unassigned patient Reason for Consult: Epigastric pain rule out PUD. Referring Physician: [facility]-Dr. Leighton Ruff.  Dustin Singh is an 23 y.o. male.  HPI: 23 year old white male admitted on 01/25/14 and found to have appendicitis on CT without perforation, had an appendectomy done on 01/25/14. He claims he is having a lot of epigastric pain similar to the pain he was having at the time of admission. He reportedly was also found to be guaiac positive on FOBT on admission but he denies having any frank melena or hematochezia. He is scheduled to have an abdominal ultrasound tomorrow. He claims his symptoms are worse postprandially and that the appendectomy did not really help his symptoms. He denies having any dysphagia or odynophagia, diarrhea or constipation. He has occasional problems with GERD.  Past Medical History  Diagnosis Date  . GERD (gastroesophageal reflux disease)    Past Surgical History  Procedure Laterality Date  . No past surgeries    . Laparoscopic appendectomy N/A 01/25/2014    Procedure: APPENDECTOMY LAPAROSCOPIC;  Surgeon: Autumn Messing III, MD;  Location: [facility];  Service: General;  Laterality: N/A;   History reviewed. No pertinent family history.  Social History:  reports that he quit smoking about 6 months ago. His smoking use included Cigarettes. He smoked 0.00 packs per day for 8 years. He has never used smokeless tobacco. He reports that he drinks alcohol. He reports that he does not use illicit drugs.  Allergies:  Allergies  Allergen Reactions  . Sulfa Antibiotics Other (See Comments)    Reaction occurred in childhood. Pt can't remember what happened   Medications: I have reviewed the patient's current medications.  Results for orders placed or performed during the hospital encounter of 01/25/14 (from the past 48 hour(s))  Comprehensive metabolic panel     Status: Abnormal   Collection Time: 01/27/14 10:25 AM  Result Value Ref Range   Sodium 138 137 - 147 mEq/L    Potassium 4.0 3.7 - 5.3 mEq/L   Chloride 97 96 - 112 mEq/L   CO2 30 19 - 32 mEq/L   Glucose, Bld 99 70 - 99 mg/dL   BUN 12 6 - 23 mg/dL   Creatinine, Ser 0.79 0.50 - 1.35 mg/dL   Calcium 8.9 8.4 - 10.5 mg/dL   Total Protein 6.9 6.0 - 8.3 g/dL   Albumin 3.3 (L) 3.5 - 5.2 g/dL   AST 13 0 - 37 U/L   ALT 16 0 - 53 U/L   Alkaline Phosphatase 73 39 - 117 U/L   Total Bilirubin 1.1 0.3 - 1.2 mg/dL   GFR calc non Af Amer >90 >90 mL/min   GFR calc Af Amer >90 >90 mL/min    Comment: (NOTE) The eGFR has been calculated using the CKD EPI equation. This calculation has not been validated in all clinical situations. eGFR's persistently <90 mL/min signify possible Chronic Kidney Disease.    Anion gap 11 5 - 15  Lipase, blood     Status: None   Collection Time: 01/27/14 10:25 AM  Result Value Ref Range   Lipase 12 11 - 59 U/L  CBC     Status: Abnormal   Collection Time: 01/27/14 10:25 AM  Result Value Ref Range   WBC 7.9 4.0 - 10.5 K/uL   RBC 3.72 (L) 4.22 - 5.81 MIL/uL   Hemoglobin 11.5 (L) 13.0 - 17.0 g/dL    Comment: REPEATED TO VERIFY DELTA CHECK NOTED    HCT 33.5 (L) 39.0 - 52.0 %  MCV 90.1 78.0 - 100.0 fL   MCH 30.9 26.0 - 34.0 pg   MCHC 34.3 30.0 - 36.0 g/dL   RDW 11.8 11.5 - 15.5 %   Platelets 177 150 - 400 K/uL    Comment: REPEATED TO VERIFY DELTA CHECK NOTED   Mononucleosis screen     Status: None   Collection Time: 01/27/14 11:00 AM  Result Value Ref Range   Mono Screen NEGATIVE NEGATIVE   Ct Abdomen Pelvis W Contrast  01/25/2014   CLINICAL DATA:  Three-day history of abdominal pain with tenderness right lower quadrant  EXAM: CT ABDOMEN AND PELVIS WITH CONTRAST  TECHNIQUE: Multidetector CT imaging of the abdomen and pelvis was performed using the standard protocol following bolus administration of intravenous contrast. Oral contrast was also administered.  CONTRAST:  73mL OMNIPAQUE IOHEXOL 300 MG/ML SOLN, 142mL OMNIPAQUE IOHEXOL 300 MG/ML SOLN  COMPARISON:  None.   FINDINGS: Lung bases are clear.  Liver is enlarged, measuring 19.3 cm in length. There is hepatic steatosis. No focal liver lesions are identified. Gallbladder wall is not thickened. There is no biliary duct dilatation.  Spleen is prominent measuring 12.5 x 13.7 x 9.1 cm. No focal splenic lesions are identified.  Pancreas and adrenals appear normal. Kidneys bilaterally show no mass or hydronephrosis on either side. There is no renal or ureteral calculus on either side.  In the pelvis, urinary bladder is midline with normal wall thickness. There is no pelvic mass or fluid collection.  The appendix is dilated with surrounding inflammation. There are two appendicoliths within the appendix. This appearance is consistent with acute appendicitis. There is no frank abscess. There is edema at the base of the appendix impressing upon the medial cecum.  There is no bowel obstruction.  No free air or portal venous air.  There is no ascites, adenopathy, or abscess in the abdomen or pelvis. There is no demonstrable abdominal aortic aneurysm. There are no blastic or lytic bone lesions.  IMPRESSION: Evidence of acute appendicitis without frank abscess.  Enlarged liver and spleen.  Hepatic steatosis is present.  No adenopathy. No abscess. No bowel obstruction. No renal or ureteral calculus. No hydronephrosis.  Critical Value/emergent results were called by telephone at the time of interpretation on 01/25/2014 at 8:25 pm to [facility], [place] , who verbally acknowledged these results.   Electronically Signed   By: Lowella Grip M.D.   On: 01/25/2014 20:25   Review of Systems  Constitutional: Negative.   HENT: Negative.   Eyes: Negative.   Respiratory: Negative.   Cardiovascular: Negative.   Gastrointestinal: Positive for nausea, vomiting, abdominal pain and blood in stool. Negative for diarrhea, constipation and melena.  Musculoskeletal: Negative.   Neurological: Negative.   Endo/Heme/Allergies: Negative.    Psychiatric/Behavioral: Negative.    Blood pressure 133/72, pulse 75, temperature 99.1 F (37.3 C), temperature source Oral, resp. rate 18, height $RemoveBe'6\' 1"'BwQgBArKk$  (1.854 m), weight 83.915 kg (185 lb), SpO2 99 %. Physical Exam  Constitutional: He is oriented to person, place, and time. He appears well-developed and well-nourished.  HENT:  Head: Normocephalic and atraumatic.  Eyes: Conjunctivae and EOM are normal. Pupils are equal, round, and reactive to light.  Neck: Normal range of motion. Neck supple.  Cardiovascular: Normal rate and regular rhythm.   Respiratory: Effort normal and breath sounds normal.  GI: Soft. Bowel sounds are normal. There is tenderness in the right upper quadrant, right lower quadrant, epigastric area and periumbilical area. There is guarding. There is no rebound.  Musculoskeletal: Normal range of motion.  Neurological: He is alert and oriented to person, place, and time.  Skin: Skin is warm and dry.  Psychiatric: He has a normal mood and affect. His behavior is normal. Judgment and thought content normal.   Assessment/Plan: 1) Epigastric and RUQ tenderness with postprandial nausea/GERD/?Guaiac positive stool-On PPI's; we will wait to see the results of the ultrasound and make further recommendations.  2) Hepatosplenomegaly on CT of unclear etiology.  3) Hepatic steatosis on CT.  4) s/p Appendectomy for acute appendicitis. Dr. Ulice Dash Pyrtle to cover over the weekend. Levie Wages 01/27/2014, 6:56 PM

## 2014-01-27 NOTE — Progress Notes (Signed)
Patient ID: Dustin SoursScott Singh, male   DOB: 06/18/1990, 23 y.o.   MRN: [id number] 2 Days Post-Op  Subjective: Pt still c/o epigastric and upper abdominal pain.  This is the same pain he presented to the ED with.  His appendicitis pain is not what's bothering him.  No nausea. Tolerating a diet, but eating makes his pain worse  Objective: Vital signs in last 24 hours: Temp:  [98.2 F (36.8 C)-99.4 F (37.4 C)] 98.7 F (37.1 C) (11/06 0522) Pulse Rate:  [88-102] 99 (11/06 0522) Resp:  [16-20] 18 (11/06 0522) BP: (115-123)/(51-71) 123/51 mmHg (11/06 0522) SpO2:  [97 %-99 %] 98 % (11/06 0522) Last BM Date: 01/24/14  Intake/Output from previous day: 11/05 0701 - 11/06 0700 In: 2498.3 [P.O.:1080; I.V.:1418.3] Out: 2600 [Urine:2600] Intake/Output this shift: Total I/O In: 0  Out: 700 [Urine:700]  PE: Abd: soft, tender throughout upper abdomen, +BS, ND, incisions c/d/i   Lab Results:   Recent Labs  01/25/14 1625  WBC 17.1*  HGB 16.7  HCT 45.7  PLT 278   BMET  Recent Labs  01/25/14 1625  NA 138  K 4.7  CL 93*  CO2 30  GLUCOSE 117*  BUN 14  CREATININE 0.99  CALCIUM 10.7*   PT/INR No results for input(s): LABPROT, INR in the last 72 hours. CMP     Component Value Date/Time   NA 138 01/25/2014 1625   K 4.7 01/25/2014 1625   CL 93* 01/25/2014 1625   CO2 30 01/25/2014 1625   GLUCOSE 117* 01/25/2014 1625   BUN 14 01/25/2014 1625   CREATININE 0.99 01/25/2014 1625   CALCIUM 10.7* 01/25/2014 1625   PROT 8.9* 01/25/2014 1625   ALBUMIN 4.9 01/25/2014 1625   AST 16 01/25/2014 1625   ALT 30 01/25/2014 1625   ALKPHOS 110 01/25/2014 1625   BILITOT 1.1 01/25/2014 1625   GFRNONAA >90 01/25/2014 1625   GFRAA >90 01/25/2014 1625   Lipase     Component Value Date/Time   LIPASE 11 01/25/2014 1625       Studies/Results: Ct Abdomen Pelvis W Contrast  01/25/2014   CLINICAL DATA:  Three-day history of abdominal pain with tenderness right lower quadrant  EXAM: CT ABDOMEN  AND PELVIS WITH CONTRAST  TECHNIQUE: Multidetector CT imaging of the abdomen and pelvis was performed using the standard protocol following bolus administration of intravenous contrast. Oral contrast was also administered.  CONTRAST:  25mL OMNIPAQUE IOHEXOL 300 MG/ML SOLN, 100mL OMNIPAQUE IOHEXOL 300 MG/ML SOLN  COMPARISON:  None.  FINDINGS: Lung bases are clear.  Liver is enlarged, measuring 19.3 cm in length. There is hepatic steatosis. No focal liver lesions are identified. Gallbladder wall is not thickened. There is no biliary duct dilatation.  Spleen is prominent measuring 12.5 x 13.7 x 9.1 cm. No focal splenic lesions are identified.  Pancreas and adrenals appear normal. Kidneys bilaterally show no mass or hydronephrosis on either side. There is no renal or ureteral calculus on either side.  In the pelvis, urinary bladder is midline with normal wall thickness. There is no pelvic mass or fluid collection.  The appendix is dilated with surrounding inflammation. There are two appendicoliths within the appendix. This appearance is consistent with acute appendicitis. There is no frank abscess. There is edema at the base of the appendix impressing upon the medial cecum.  There is no bowel obstruction.  No free air or portal venous air.  There is no ascites, adenopathy, or abscess in the abdomen or pelvis. There is no  demonstrable abdominal aortic aneurysm. There are no blastic or lytic bone lesions.  IMPRESSION: Evidence of acute appendicitis without frank abscess.  Enlarged liver and spleen.  Hepatic steatosis is present.  No adenopathy. No abscess. No bowel obstruction. No renal or ureteral calculus. No hydronephrosis.  Critical Value/emergent results were called by telephone at the time of interpretation on 01/25/2014 at 8:25 pm to [facility] , who verbally acknowledged these results.   Electronically Signed   By: Bretta BangWilliam  Woodruff M.D.   On: 01/25/2014 20:25    Anti-infectives: Anti-infectives    Start      Dose/Rate Route Frequency Ordered Stop   01/26/14 2200  ertapenem (INVANZ) 1 g in sodium chloride 0.9 % 50 mL IVPB     1 g100 mL/hr over 30 Minutes Intravenous Every 24 hours 01/26/14 0109     01/25/14 2200  ertapenem (INVANZ) 1 g in sodium chloride 0.9 % 50 mL IVPB  Status:  Discontinued     1 g100 mL/hr over 30 Minutes Intravenous Every 24 hours 01/25/14 2145 01/26/14 0139       Assessment/Plan  1. POD 2, s/p lap appy 2. Persistent upper abdominal pain 3. CT evidence of splenomegaly and hepatomegaly  Plan: 1. Will check a mono test today given CT scan findings and young age.  Will also check a CBC, CMEt, and a lipase as well.  May need an US of abdomen to better evaluate gallbladder if LFTs up or mono negative.  Will continue to treat symptomatically for now.   LOS: 2 days    Ta Fair E 01/27/2014, 10:51 AM Pager: [phone]

## 2014-01-28 ENCOUNTER — Inpatient Hospital Stay (HOSPITAL_COMMUNITY): Payer: MEDICAID

## 2014-01-28 DIAGNOSIS — K3580 Unspecified acute appendicitis: Secondary | ICD-10-CM | POA: Insufficient documentation

## 2014-01-28 DIAGNOSIS — R1013 Epigastric pain: Secondary | ICD-10-CM | POA: Insufficient documentation

## 2014-01-28 MED ORDER — SUCRALFATE 1 GM/10ML PO SUSP
1.0000 g | Freq: Three times a day (TID) | ORAL | Status: DC
  Administered 2014-01-28 – 2014-01-29 (×4): 1 g via ORAL
  Filled 2014-01-28 (×8): qty 10

## 2014-01-28 MED ORDER — PANTOPRAZOLE SODIUM 40 MG PO TBEC
40.0000 mg | DELAYED_RELEASE_TABLET | Freq: Two times a day (BID) | ORAL | Status: DC
  Administered 2014-01-28 – 2014-01-29 (×2): 40 mg via ORAL
  Filled 2014-01-28 (×3): qty 1

## 2014-01-28 NOTE — Plan of Care (Signed)
Problem: Phase II Progression Outcomes Goal: Pain controlled Outcome: Completed/Met Date Met:  01/28/14 Goal: Progress activity as tolerated unless otherwise ordered Outcome: Completed/Met Date Met:  01/28/14 Goal: Vital signs stable Outcome: Completed/Met Date Met:  01/28/14 Goal: Surgical site without signs of infection Outcome: Completed/Met Date Met:  01/28/14 Goal: Dressings dry/intact Outcome: Completed/Met Date Met:  01/28/14 Goal: Sutures/staples intact Outcome: Completed/Met Date Met:  01/28/14 Goal: Foley discontinued Outcome: Not Applicable Date Met:  50/27/14 Goal: Discharge plan established Outcome: Completed/Met Date Met:  01/28/14 Goal: Tolerating diet Outcome: Completed/Met Date Met:  01/28/14

## 2014-01-28 NOTE — Progress Notes (Signed)
Patient ID: Dustin Singh, male   DOB: [birth year], 23 y.o.   MRN: [id number] 3 Days Post-Op  Subjective: Some lower abdominal pain, about the same. Eating a little. No nausea or vomiting. No bowel function yet. Getting up to go to the bathroom.  Objective: Vital signs in last 24 hours: Temp:  [98.4 F (36.9 C)-99.3 F (37.4 C)] 98.8 F (37.1 C) (11/07 0558) Pulse Rate:  [63-102] 85 (11/07 0558) Resp:  [18] 18 (11/07 0558) BP: (115-133)/(52-72) 115/52 mmHg (11/07 0558) SpO2:  [97 %-100 %] 100 % (11/07 0558) Last BM Date: 01/24/14  Intake/Output from previous day: 11/06 0701 - 11/07 0700 In: 2160 [P.O.:960; I.V.:1200] Out: 3975 [Urine:3975] Intake/Output this shift:    General appearance: alert, cooperative and no distress GI: very tender across the lower abdomen, right greater than left, almost to light touch Incision/Wound: clean and dry  Lab Results:   Recent Labs  01/25/14 1625 01/27/14 1025  WBC 17.1* 7.9  HGB 16.7 11.5*  HCT 45.7 33.5*  PLT 278 177   BMET  Recent Labs  01/25/14 1625 01/27/14 1025  NA 138 138  K 4.7 4.0  CL 93* 97  CO2 30 30  GLUCOSE 117* 99  BUN 14 12  CREATININE 0.99 0.79  CALCIUM 10.7* 8.9     Studies/Results: No results found.  Anti-infectives: Anti-infectives    Start     Dose/Rate Route Frequency Ordered Stop   01/26/14 2200  ertapenem (INVANZ) 1 g in sodium chloride 0.9 % 50 mL IVPB     1 g100 mL/hr over 30 Minutes Intravenous Every 24 hours 01/26/14 0109     01/25/14 2200  ertapenem (INVANZ) 1 g in sodium chloride 0.9 % 50 mL IVPB  Status:  Discontinued     1 g100 mL/hr over 30 Minutes Intravenous Every 24 hours 01/25/14 2145 01/26/14 0139      Assessment/Plan: s/p Procedure(s): APPENDECTOMY LAPAROSCOPIC Some local contamination noted at the time of surgery. On IV antibiotics. He does not appear ill but his abdomen is markedly tender. Continue IV antibiotics. Check CBC today and in a.m. Does not appear ready for  discharge.   LOS: 3 days    Kataleia Quaranta T 01/28/2014

## 2014-01-28 NOTE — Plan of Care (Signed)
Problem: Phase I Progression Outcomes Goal: Pain controlled with appropriate interventions Outcome: Progressing Pt satisfied with current level of relief. Goal: OOB as tolerated unless otherwise ordered Outcome: Progressing Ambulating in room. Goal: Incision/dressings dry and intact Outcome: Completed/Met Date Met:  01/28/14 Goal: Sutures/staples intact Outcome: Not Applicable Date Met:  38/75/64 Goal: Tubes/drains patent Outcome: Not Applicable Date Met:  33/29/51 Goal: Initial discharge plan identified Outcome: Completed/Met Date Met:  01/28/14

## 2014-01-28 NOTE — Progress Notes (Signed)
     [facility] Progress Note  Subjective:  Patient still has epigastric pain but says that it comes and goes and has been less frequent.  When it is present it goes into his back.  Does not want EGD.  Objective:  Vital signs in last 24 hours: Temp:  [98.4 F (36.9 C)-99.3 F (37.4 C)] 98.8 F (37.1 C) (11/07 0558) Pulse Rate:  [63-102] 85 (11/07 0558) Resp:  [18] 18 (11/07 0558) BP: (115-133)/(52-72) 115/52 mmHg (11/07 0558) SpO2:  [97 %-100 %] 100 % (11/07 0558) Last BM Date: 01/24/14 General:  Alert, Well-developed, in NAD Heart:  Regular rate and rhythm; no murmurs Pulm:  CTAB.  No W/R/R. Abdomen:  Soft, non-distended.  BS present.  Diffuse TTP some probably appropriate post surgery. Extremities:  Without edema. Neurologic:  Alert and  oriented x4;  grossly normal neurologically. Psych:  Alert and cooperative. Normal mood and affect.  Intake/Output from previous day: 11/06 0701 - 11/07 0700 In: 2160 [P.O.:960; I.V.:1200] Out: 3975 [Urine:3975]  Lab Results:  Recent Labs  01/25/14 1625 01/27/14 1025  WBC 17.1* 7.9  HGB 16.7 11.5*  HCT 45.7 33.5*  PLT 278 177   BMET  Recent Labs  01/25/14 1625 01/27/14 1025  NA 138 138  K 4.7 4.0  CL 93* 97  CO2 30 30  GLUCOSE 117* 99  BUN 14 12  CREATININE 0.99 0.79  CALCIUM 10.7* 8.9   LFT  Recent Labs  01/27/14 1025  PROT 6.9  ALBUMIN 3.3*  AST 13  ALT 16  ALKPHOS 73  BILITOT 1.1   Assessment / Plan: 1) Epigastric and RUQ tenderness with postprandial nausea/GERD/? Guaiac positive stool-On PPI daily and carafate BID just started on 11/6; ultrasound showing only mild splenomegaly (unsure of the cause of this and should be followed up by PCP). 2) s/p Appendectomy for acute appendicitis on 11/4.  *Patient does not want EGD.  Would like to just stay on PPI and carafate to see how he does.  Will increase PPI to twice a day and carafate to four times per day.  Can follow-up as outpatient with Dr.  Loreta AveMann.   LOS: 3 days   Audley Hinojos D.  01/28/2014, 8:55 AM  Pager number [phone]

## 2014-01-29 LAB — CBC
HEMATOCRIT: 29.4 % — AB (ref 39.0–52.0)
Hemoglobin: 10.2 g/dL — ABNORMAL LOW (ref 13.0–17.0)
MCH: 30.8 pg (ref 26.0–34.0)
MCHC: 34.7 g/dL (ref 30.0–36.0)
MCV: 88.8 fL (ref 78.0–100.0)
Platelets: 226 10*3/uL (ref 150–400)
RBC: 3.31 MIL/uL — ABNORMAL LOW (ref 4.22–5.81)
RDW: 11.7 % (ref 11.5–15.5)
WBC: 3.8 10*3/uL — ABNORMAL LOW (ref 4.0–10.5)

## 2014-01-29 MED ORDER — SUCRALFATE 1 GM/10ML PO SUSP: 1.0000 g | Freq: Three times a day (TID) | ORAL | Status: AC

## 2014-01-29 MED ORDER — OXYCODONE-ACETAMINOPHEN 5-325 MG PO TABS: 1.0000 | ORAL_TABLET | ORAL | Status: AC | PRN

## 2014-01-29 MED ORDER — PANTOPRAZOLE SODIUM 40 MG PO TBEC: 40.0000 mg | DELAYED_RELEASE_TABLET | Freq: Two times a day (BID) | ORAL | Status: AC

## 2014-01-29 MED ORDER — HYDROCODONE-ACETAMINOPHEN 5-325 MG PO TABS: 1.0000 | ORAL_TABLET | ORAL | Status: AC | PRN

## 2014-01-29 NOTE — Plan of Care (Signed)
Problem: Phase II Progression Outcomes Goal: Other Phase II Outcomes/Goals Outcome: Completed/Met Date Met:  01/29/14  Problem: Phase III Progression Outcomes Goal: Pain controlled on oral analgesia Outcome: Completed/Met Date Met:  01/29/14 Goal: Activity at appropriate level-compared to baseline (UP IN CHAIR FOR HEMODIALYSIS)  Outcome: Adequate for Discharge  Problem: Discharge Progression Outcomes Goal: Barriers To Progression Addressed/Resolved Outcome: Completed/Met Date Met:  01/29/14 Goal: Discharge plan in place and appropriate Outcome: Completed/Met Date Met:  01/29/14 Goal: Pain controlled with appropriate interventions Outcome: Completed/Met Date Met:  01/29/14 Goal: Hemodynamically stable Outcome: Completed/Met Date Met:  96/75/91 Goal: Complications resolved/controlled Outcome: Completed/Met Date Met:  01/29/14 Goal: Tolerating diet Outcome: Completed/Met Date Met:  01/29/14 Goal: Activity appropriate for discharge plan Outcome: Adequate for Discharge Goal: Tubes and drains discontinued if indicated Outcome: Not Applicable Date Met:  63/84/66 Goal: Staples/sutures removed Outcome: Not Applicable Date Met:  59/93/57 Goal: Steri-Strips applied Outcome: Not Applicable Date Met:  01/77/93 Goal: Other Discharge Outcomes/Goals Outcome: Completed/Met Date Met:  01/29/14

## 2014-01-29 NOTE — Discharge Summary (Signed)
Reviewed discharge instructions with pt including follow-up appointments, incision care, and medications. Pt requesting Percocet for d/c as he has been taking while in the hospital (MD notified).  No other questions/concerns voiced.  Pt being d/c into care of fiance.

## 2014-01-29 NOTE — Plan of Care (Signed)
Problem: Discharge Progression Outcomes Goal: Activity appropriate for discharge plan Outcome: Completed/Met Date Met:  01/29/14     

## 2014-01-29 NOTE — Progress Notes (Signed)
Patient ID: Dustin Singh, male   DOB: 06/23/1990, 23 y.o.   MRN: [id number] 4 Days Post-Op  Subjective: Feel significantly better today. He feels that the Protonix and Carafate has helped his pain.  Objective: Vital signs in last 24 hours: Temp:  [98 F (36.7 C)-98.8 F (37.1 C)] 98 F (36.7 C) (11/08 0615) Pulse Rate:  [75-80] 80 (11/08 0615) Resp:  [18-20] 20 (11/08 0615) BP: (110-112)/(63-70) 112/70 mmHg (11/08 0615) SpO2:  [99 %] 99 % (11/08 0615) Last BM Date: 01/24/14  Intake/Output from previous day: 11/07 0701 - 11/08 0700 In: 1440 [P.O.:240; I.V.:1200] Out: 1050 [Urine:1050] Intake/Output this shift:    General appearance: alert, cooperative and no distress GI: normal findings: soft, non-tender Incision/Wound: clean and dry without evidence of infection  Lab Results:   Recent Labs  01/27/14 1025 01/29/14 0524  WBC 7.9 3.8*  HGB 11.5* 10.2*  HCT 33.5* 29.4*  PLT 177 226   BMET  Recent Labs  01/27/14 1025  NA 138  K 4.0  CL 97  CO2 30  GLUCOSE 99  BUN 12  CREATININE 0.79  CALCIUM 8.9     Studies/Results: [place] Abdomen Complete  01/28/2014   CLINICAL DATA:  Epigastric pain for 5 days.  EXAM: ULTRASOUND ABDOMEN COMPLETE  COMPARISON:  None.  FINDINGS: Gallbladder: No gallstones or wall thickening visualized. No sonographic Murphy sign noted.  Common bile duct: Diameter: 4 mm  Liver: No focal lesion identified. Within normal limits in parenchymal echogenicity.  IVC: No abnormality visualized.  Pancreas: Limited visualization.  No indication of pathology.  Spleen: 12.4 x 13.5 x 7 cm splenic size with a 600 mL volume. No focal abnormality.  Right Kidney: Length: 12 cm. Echogenicity within normal limits. No mass or hydronephrosis visualized.  Left Kidney: Length: 12 cm. Echogenicity within normal limits. No mass or hydronephrosis visualized.  Abdominal aorta: No aneurysm visualized.  Other findings: None.  IMPRESSION: 1. No acute intra-abdominal findings.   Negative gallbladder. 2. Mild splenomegaly.   Electronically Signed   By: Tiburcio PeaJonathan  Watts M.D.   On: 01/28/2014 09:30    Anti-infectives: Anti-infectives    Start     Dose/Rate Route Frequency Ordered Stop   01/26/14 2200  ertapenem (INVANZ) 1 g in sodium chloride 0.9 % 50 mL IVPB     1 g100 mL/hr over 30 Minutes Intravenous Every 24 hours 01/26/14 0109     01/25/14 2200  ertapenem (INVANZ) 1 g in sodium chloride 0.9 % 50 mL IVPB  Status:  Discontinued     1 g100 mL/hr over 30 Minutes Intravenous Every 24 hours 01/25/14 2145 01/26/14 0139      Assessment/Plan: s/p Procedure(s): APPENDECTOMY LAPAROSCOPIC Much improved today. Gallbladder ultrasound was negative. Patient declined endoscopy. Seems okay for discharge. Continue Carafate and Protonix for 4 weeks and follow up with GI. Follow-up in our office in 2 weeks.   LOS: 4 days    Dustin Singh T 01/29/2014

## 2014-01-29 NOTE — Plan of Care (Signed)
Problem: Phase I Progression Outcomes Goal: Pain controlled with appropriate interventions Outcome: Completed/Met Date Met:  01/29/14 Goal: OOB as tolerated unless otherwise ordered Outcome: Completed/Met Date Met:  01/29/14 Goal: Vital signs/hemodynamically stable Outcome: Completed/Met Date Met:  01/29/14 Goal: Other Phase I Outcomes/Goals Outcome: Completed/Met Date Met:  01/29/14  Problem: Phase II Progression Outcomes Goal: Progressing with IS, TCDB Outcome: Not Applicable Date Met:  59/29/24 Goal: Return of bowel function (flatus, BM) IF ABDOMINAL SURGERY:  Outcome: Adequate for Discharge Positive flatus; no BM. Discharge instructions include strategies to promote BM.  Problem: Phase III Progression Outcomes Goal: Pain controlled on oral analgesia Outcome: Progressing Goal: Activity at appropriate level-compared to baseline (UP IN CHAIR FOR HEMODIALYSIS)  Outcome: Adequate for Discharge Goal: Voiding independently Outcome: Completed/Met Date Met:  01/29/14 Goal: IV changed to normal saline lock Outcome: Completed/Met Date Met:  01/29/14 Goal: Nasogastric tube discontinued Outcome: Not Applicable Date Met:  46/28/63 Goal: Discharge plan remains appropriate-arrangements made Outcome: Completed/Met Date Met:  01/29/14 Goal: Demonstrates TCDB, IS independently Outcome: Not Applicable Date Met:  81/77/11 Goal: Other Phase III Outcomes/Goals Outcome: Completed/Met Date Met:  01/29/14

## 2014-02-02 NOTE — Discharge Summary (Signed)
Patient ID: Dustin Singh MRN: [id number] DOB/AGE: 04-22-1990 23 y.o.  Admit date: 01/25/2014 Discharge date: 01/29/2014  Procedures: lap appy  Consults: GI  Reason for Admission:The pt is a 23yo wm who presents with abdominal pain for the last 3 days. It started in his epigastric area and today moved to the RLQ. He has had significant nausea and vomiting associated with it. He denies fever. CT shows appendicitis but no evidence of rupture   Admission Diagnoses:  1. Acute appendicitis   Hospital Course: the patient was admitted and taken to the operating room where he was found to have acute appendicitis.  He tolerated the procedure well, but had some post op pain issues.  He began complaining of persistent epigastric pain that was the same as prior to admission.  He did have some spleenogmegaly noted on his CT scan and a mono spot was ordered.  This was negative.  He then had an abdominal US which was also negative.  GI was consulted for possible ulcer disease.  He refused an endoscopy. (he was hemoccult positive)  He was placed on protonix and carafate.  This seemed to help his pain significantly.  By POD 4, the patient was stable for dc home.  Discharge Diagnoses:  Active Problems:   Acute appendicitis   Appendicitis, acute   Epigastric abdominal pain   Discharge Medications:   Medication List    STOP taking these medications        aspirin 325 MG tablet     ibuprofen 200 MG tablet  Commonly known as:  ADVIL,MOTRIN      TAKE these medications        HYDROcodone-acetaminophen 5-325 MG per tablet  Commonly known as:  NORCO/VICODIN  Take 1-2 tablets by mouth every 4 (four) hours as needed for moderate pain or severe pain.     oxyCODONE-acetaminophen 5-325 MG per tablet  Commonly known as:  ROXICET  Take 1-2 tablets by mouth every 4 (four) hours as needed for severe pain.     oxyCODONE-acetaminophen 5-325 MG per tablet  Commonly known as:  ROXICET  Take 1-2 tablets by  mouth every 4 (four) hours as needed for severe pain.     pantoprazole 40 MG tablet  Commonly known as:  PROTONIX  Take 1 tablet (40 mg total) by mouth 2 (two) times daily.     sucralfate 1 GM/10ML suspension  Commonly known as:  CARAFATE  Take 10 mLs (1 g total) by mouth 4 (four) times daily -  with meals and at bedtime.        Discharge Instructions:     Follow-up Information    [facility] [place] [facility].   Contact information:   [address]   [place] [address] [phone]       Follow up with Charna ElizabethMANN,JYOTHI, MD. Schedule an appointment as soon as possible for a visit in 1 month.   Specialty:  Gastroenterology   Contact information:   [address], [address] [place] [address] [phone]       Signed: Letha CapeOSBORNE,Lavern Crimi E 02/02/2014, 4:18 PM

## 2015-10-08 IMAGING — CT CT ABD-PELV W/ CM
1 of 2 series · 15 of 32 positions shown, 19 images · IV contrast (omnipaque)
Comparison: None.

CLINICAL DATA: Three-day history of abdominal pain with tenderness
right lower quadrant

EXAM:
CT ABDOMEN AND PELVIS WITH CONTRAST
TECHNIQUE: Multidetector CT imaging of the abdomen and pelvis was performed
using the standard protocol following bolus administration of
intravenous contrast. Oral contrast was also administered.
CONTRAST:  25mL OMNIPAQUE IOHEXOL 300 MG/ML SOLN, 100mL OMNIPAQUE
IOHEXOL 300 MG/ML SOLN

[Series 2: abd/pel with · axial · 0.82mm/px · z∈[+784,+1219]mm · 15 of 97 slices shown, 19 images]
[im 5/97  soft-tissue]
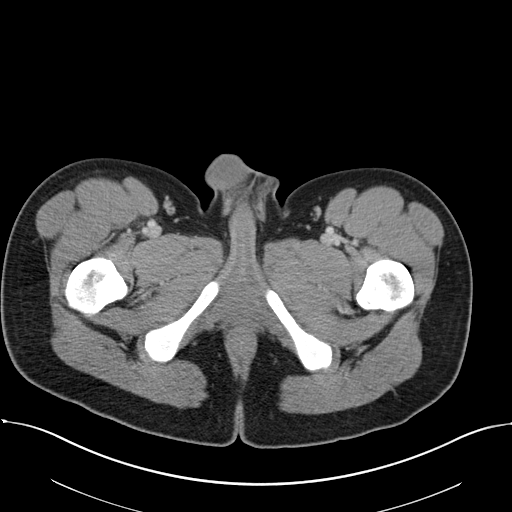
[im 5/97  bone]
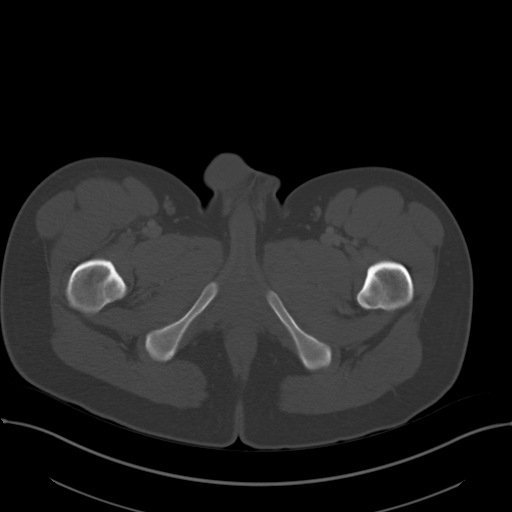
[im 14/97  soft-tissue]
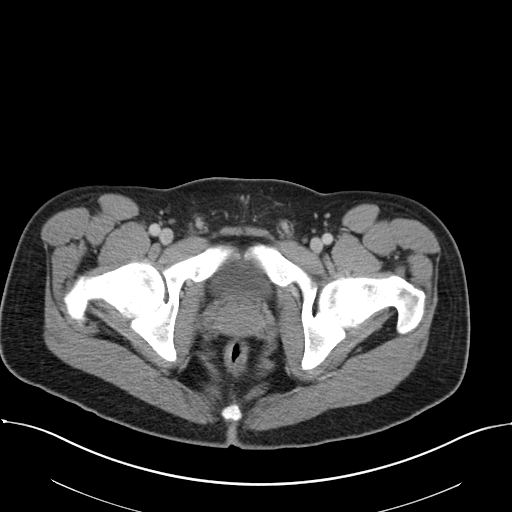
[im 22/97  soft-tissue]
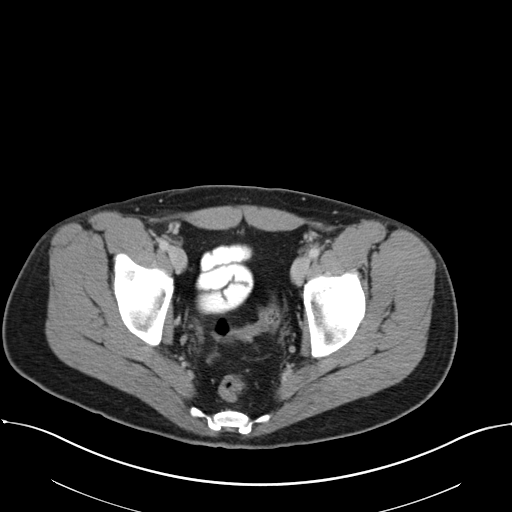
[im 27/97  soft-tissue]
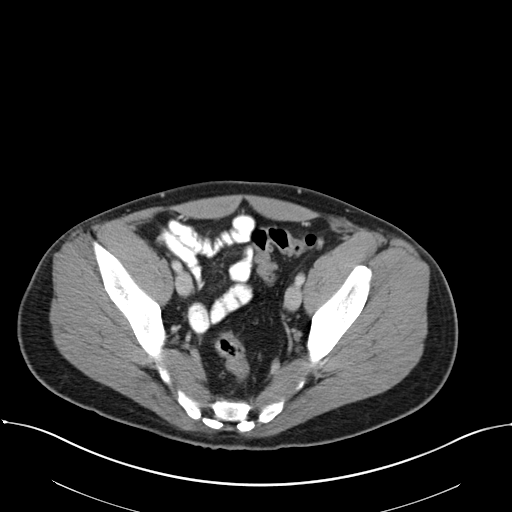
[im 35/97  soft-tissue]
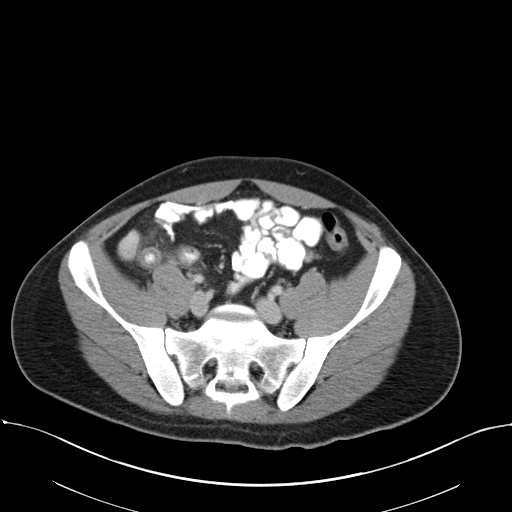
[im 40/97  soft-tissue]
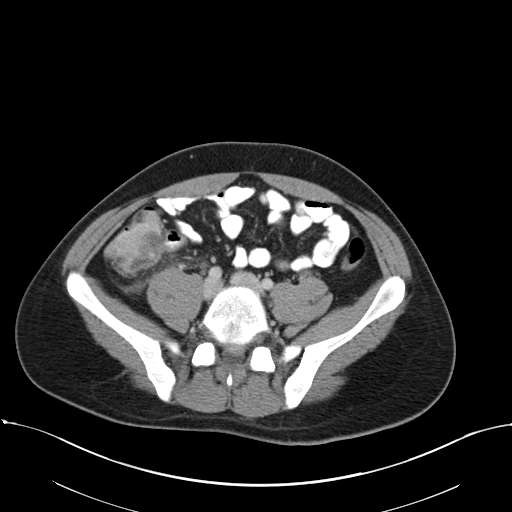
[im 49/97  soft-tissue]
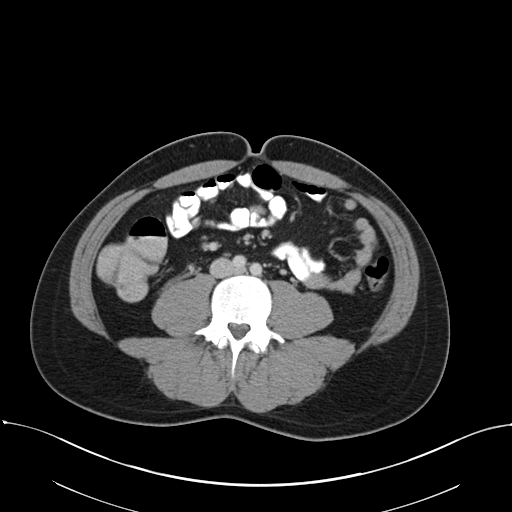
[im 57/97  soft-tissue]
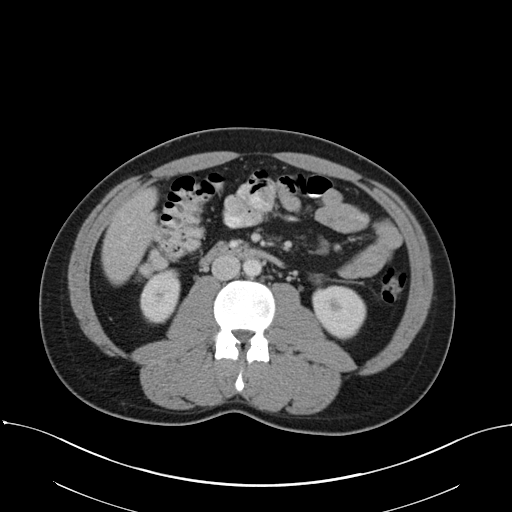
[im 62/97  soft-tissue]
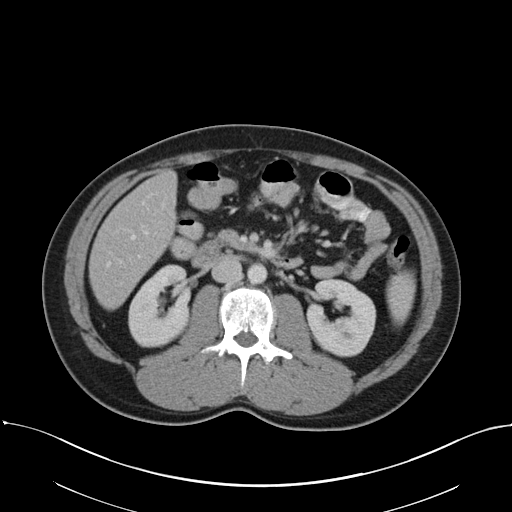
[im 62/97  bone]
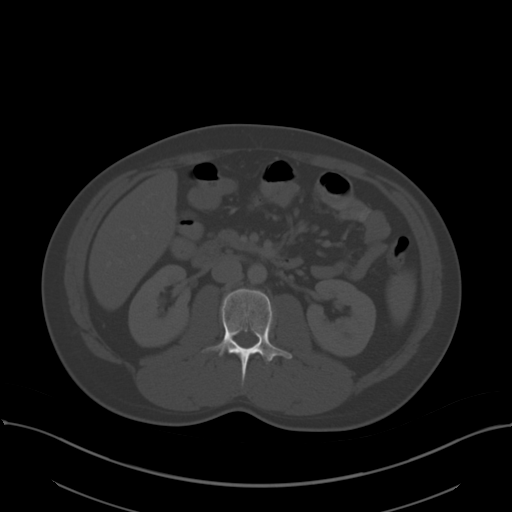
[im 70/97  soft-tissue]
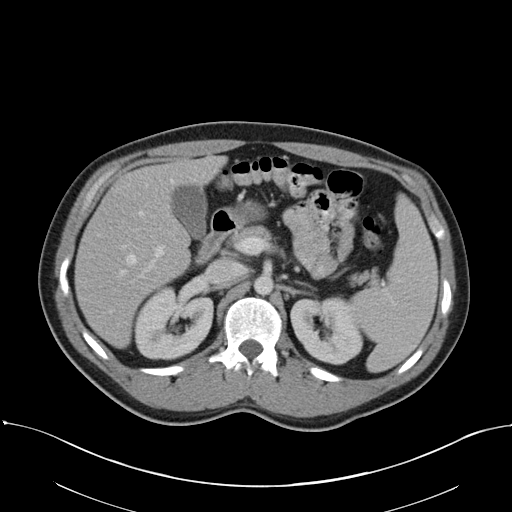
[im 75/97  soft-tissue]
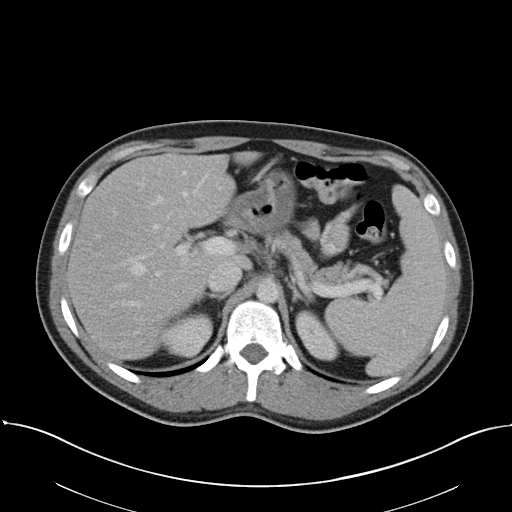
[im 79/97  lung]
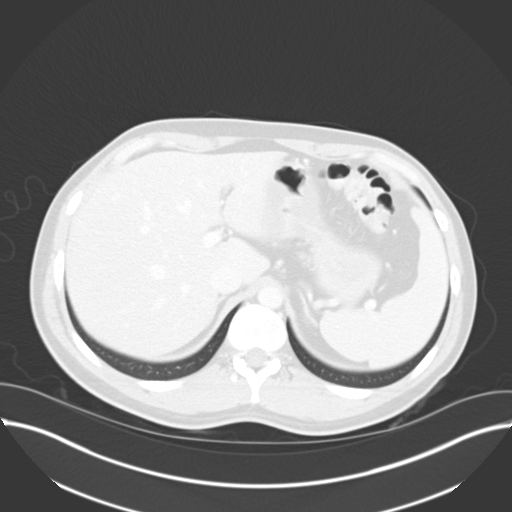
[im 83/97  soft-tissue]
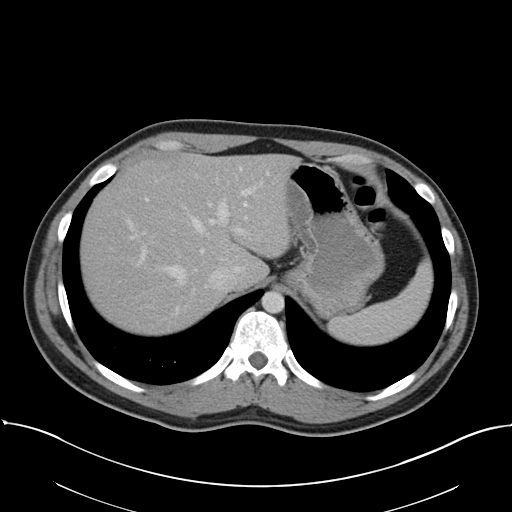
[im 83/97  lung]
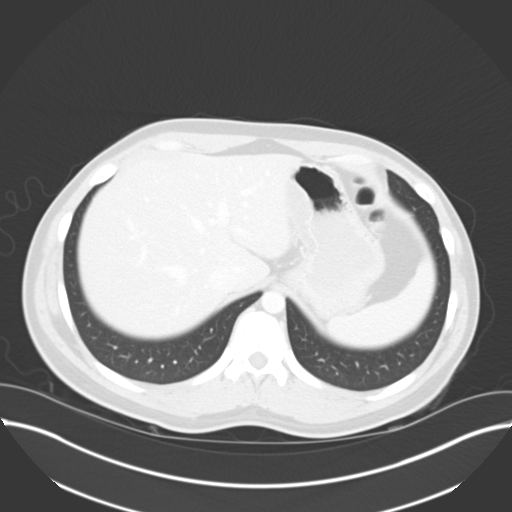
[im 88/97  lung]
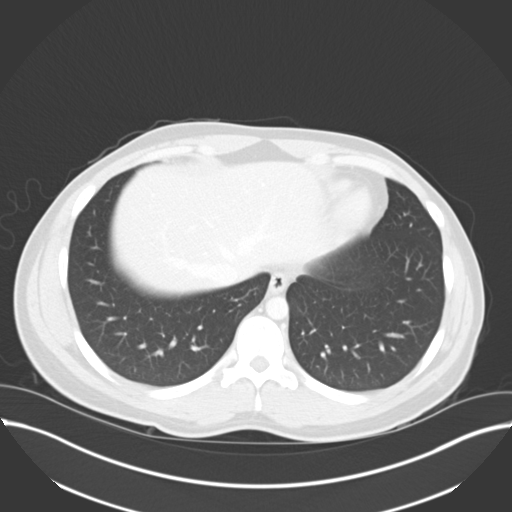
[im 92/97  soft-tissue]
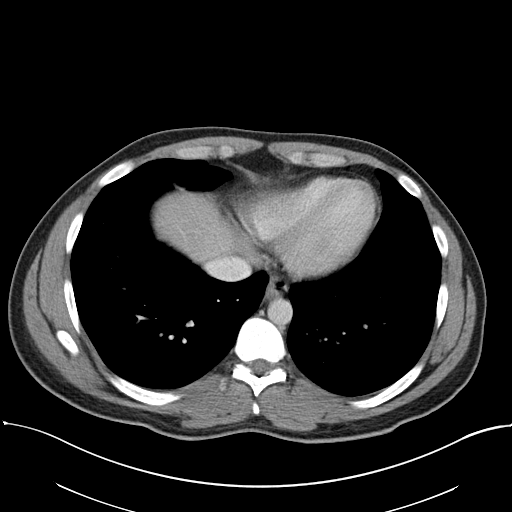
[im 92/97  lung]
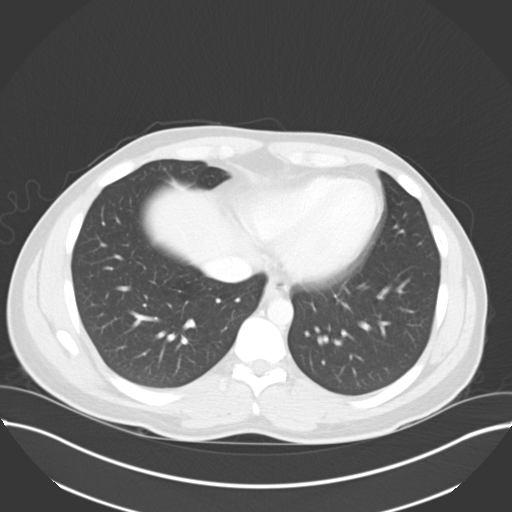

[15 of 32 positions shown; findings below may reference images not displayed]

FINDINGS: Lung bases are clear.

Liver is enlarged, measuring 19.3 cm in length. There is hepatic
steatosis. No focal liver lesions are identified. Gallbladder wall
is not thickened. There is no biliary duct dilatation.

Spleen is prominent measuring 12.5 x 13.7 x 9.1 cm. No focal splenic
lesions are identified.

Pancreas and adrenals appear normal. Kidneys bilaterally show no
mass or hydronephrosis on either side. There is no renal or ureteral
calculus on either side.

In the pelvis, urinary bladder is midline with normal wall
thickness. There is no pelvic mass or fluid collection.

The appendix is dilated with surrounding inflammation. There are two
appendicoliths within the appendix. This appearance is consistent
with acute appendicitis. There is no frank abscess. There is edema
at the base of the appendix impressing upon the medial cecum.

There is no bowel obstruction.  No free air or portal venous air.

There is no ascites, adenopathy, or abscess in the abdomen or
pelvis. There is no demonstrable abdominal aortic aneurysm. There
are no blastic or lytic bone lesions.
IMPRESSION: Evidence of acute appendicitis without frank abscess.

Enlarged liver and spleen.  Hepatic steatosis is present.

No adenopathy. No abscess. No bowel obstruction. No renal or
ureteral calculus. No hydronephrosis.

Critical Value/emergent results were called by telephone at the time
of interpretation on 01/25/2014 at [DATE] to KAGYI PANIN, PA , who
verbally acknowledged these results.

## 2015-10-11 IMAGING — US US ABDOMEN COMPLETE
1 series · 14 of 25 positions shown · non-contrast
Comparison: None.

CLINICAL DATA: Epigastric pain for 5 days.

EXAM:
ULTRASOUND ABDOMEN COMPLETE

[Series 1: us abdomen complete · 0.27mm/px · 14 of 55 slices shown]
[im 1/55]
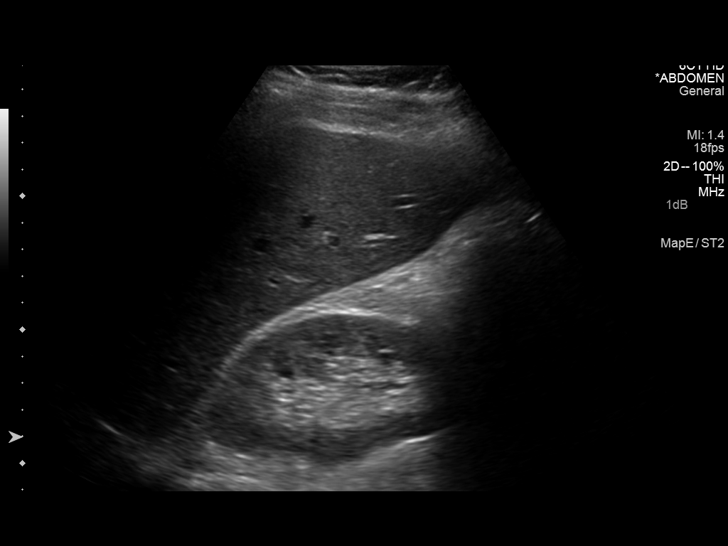
[im 5/55]
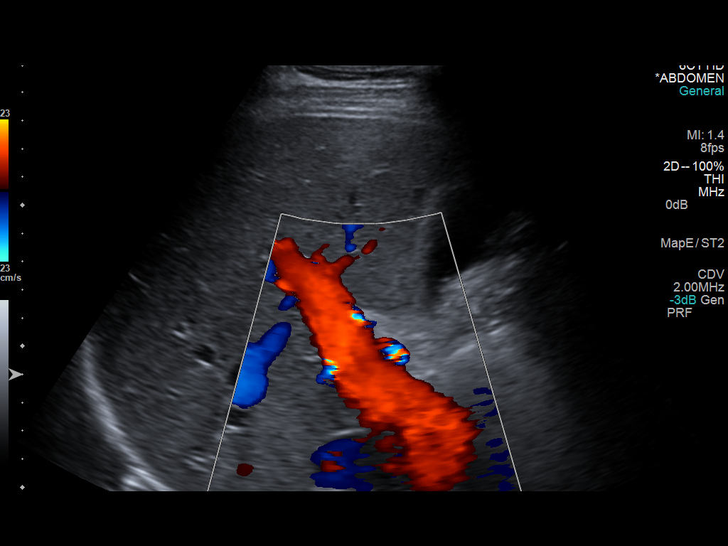
[im 10/55]
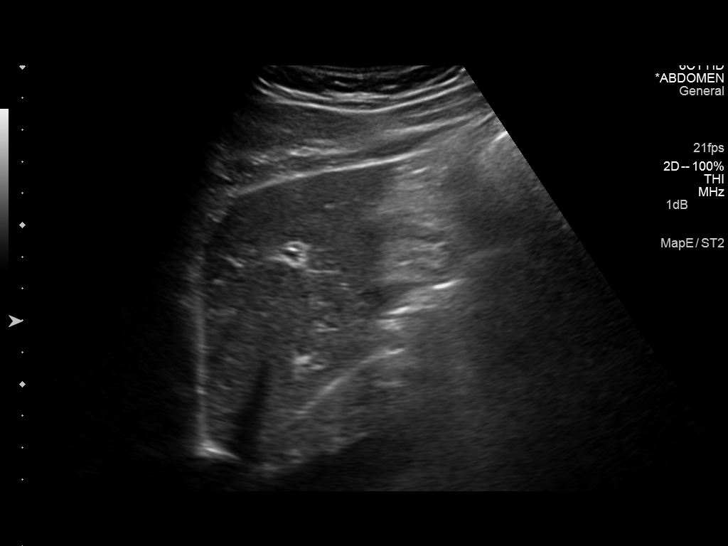
[im 14/55]
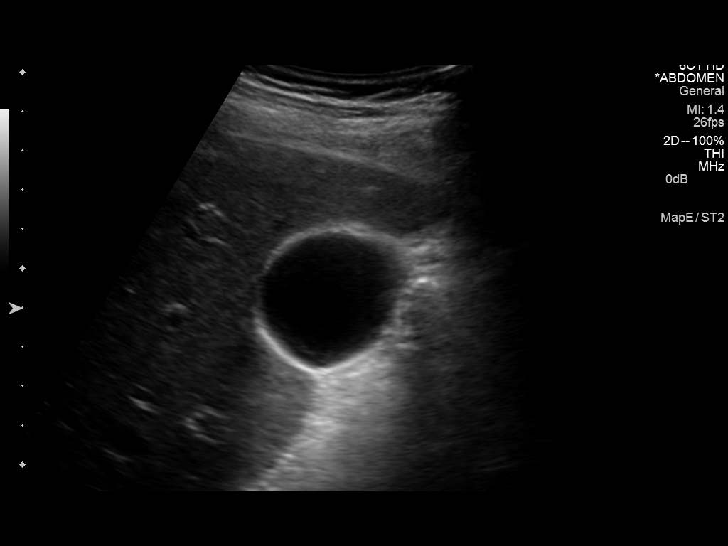
[im 19/55]
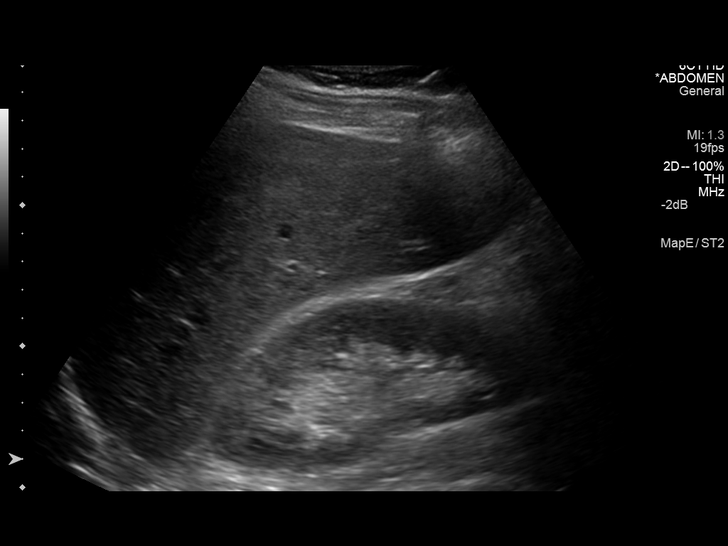
[im 21/55]
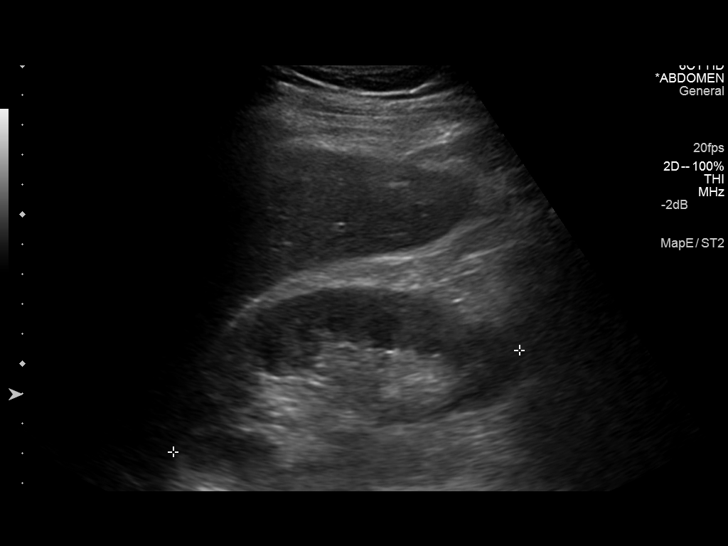
[im 25/55]
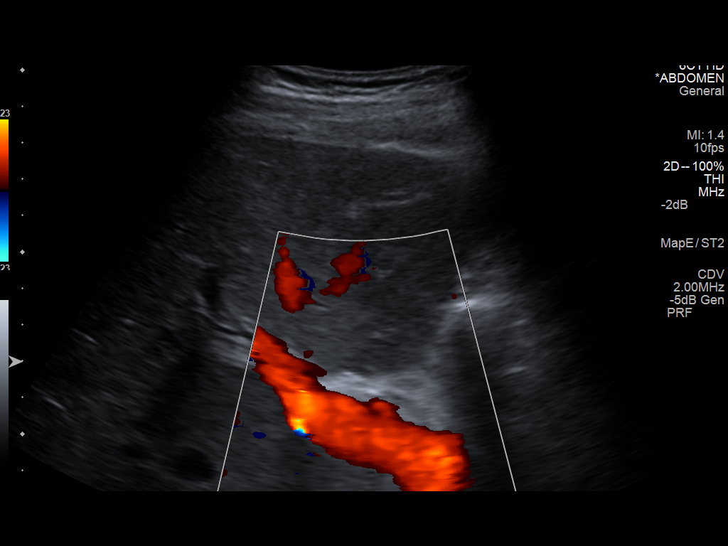
[im 30/55]
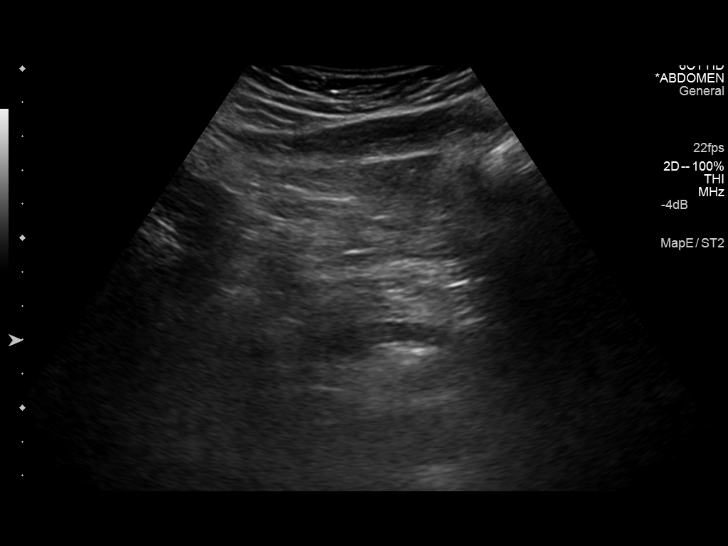
[im 34/55]
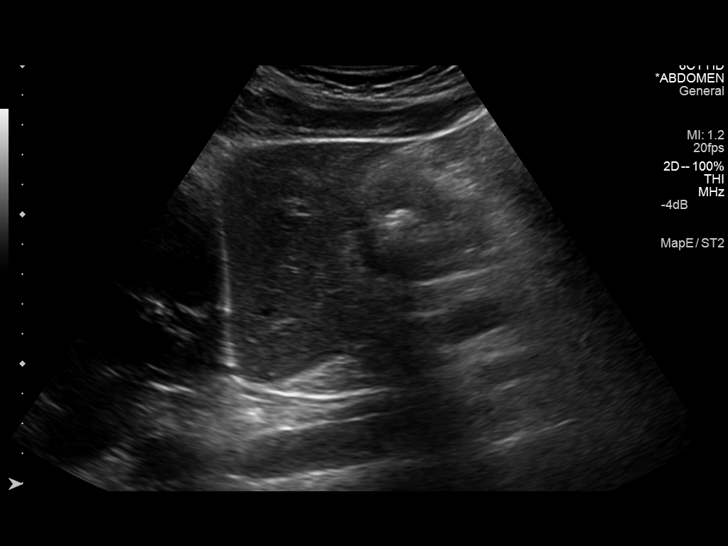
[im 37/55]
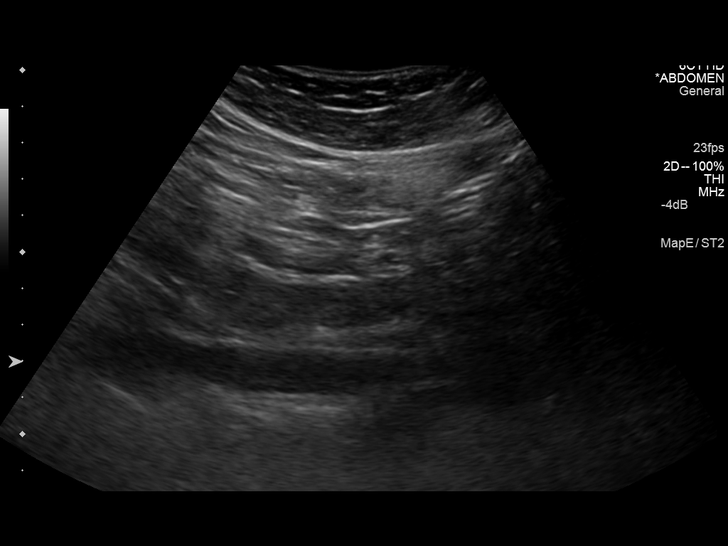
[im 41/55]
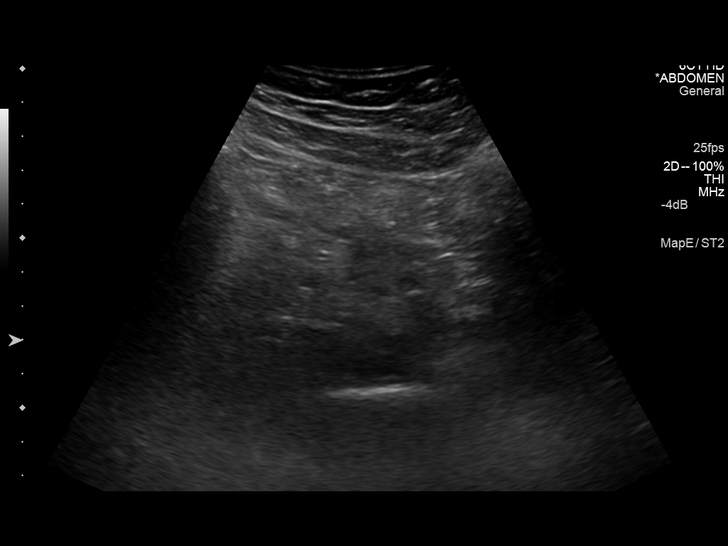
[im 46/55]
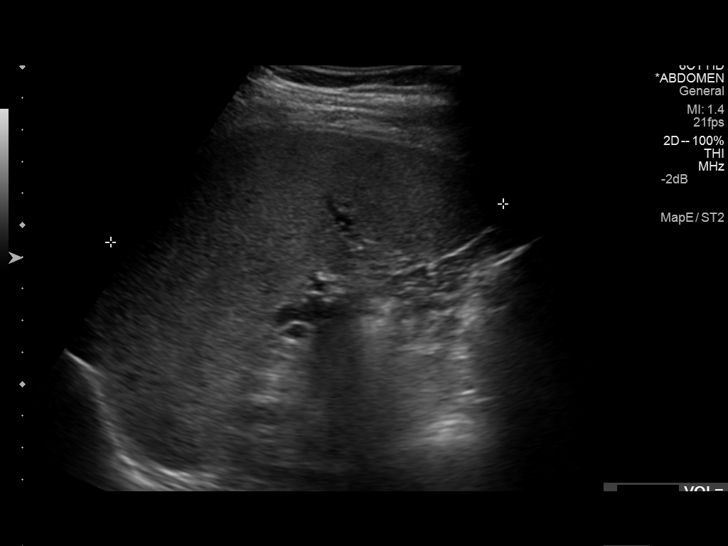
[im 50/55]
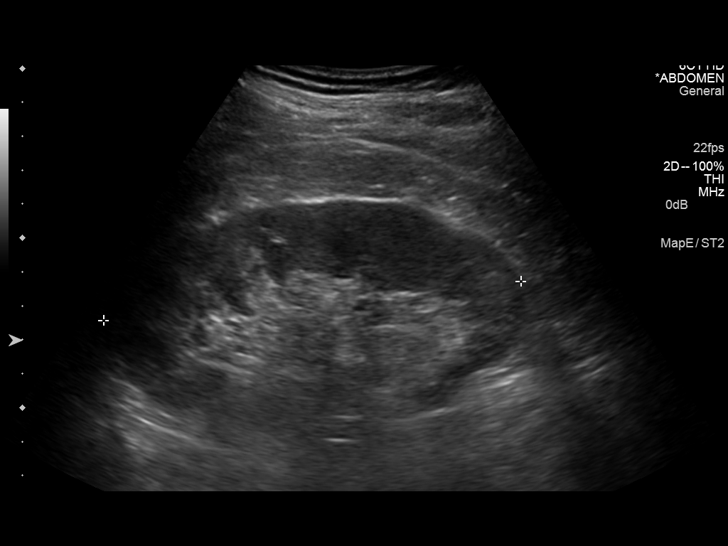
[im 55/55]
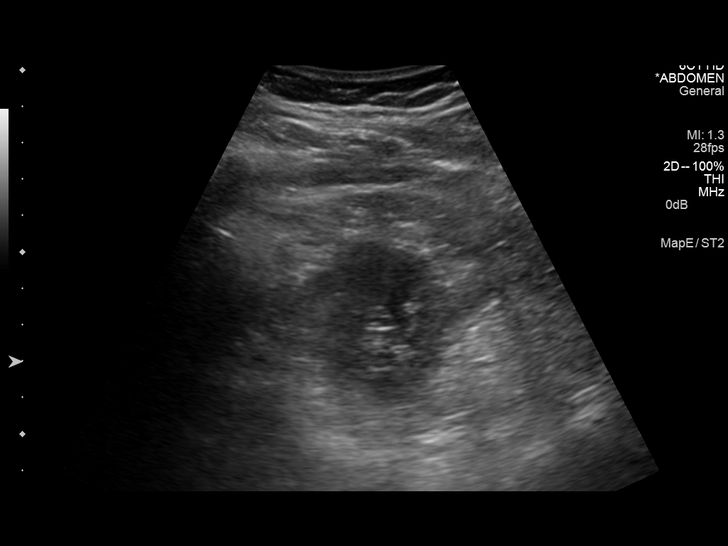

[14 of 25 positions shown; findings below may reference images not displayed]

FINDINGS: Gallbladder: No gallstones or wall thickening visualized. No
sonographic Murphy sign noted.

Common bile duct: Diameter: 4 mm

Liver: No focal lesion identified. Within normal limits in
parenchymal echogenicity.

IVC: No abnormality visualized.

Pancreas: Limited visualization.  No indication of pathology.

Spleen: 12.4 x 13.5 x 7 cm splenic size with a 600 mL volume. No
focal abnormality.

Right Kidney: Length: 12 cm. Echogenicity within normal limits. No
mass or hydronephrosis visualized.

Left Kidney: Length: 12 cm. Echogenicity within normal limits. No
mass or hydronephrosis visualized.

Abdominal aorta: No aneurysm visualized.

Other findings: None.
IMPRESSION: 1. No acute intra-abdominal findings.  Negative gallbladder.
2. Mild splenomegaly.
